# Patient Record
Sex: Male | Born: 1960 | Race: Black or African American | Hispanic: No | Smoking: Former smoker
Health system: Southern US, Community
[De-identification: ages and names within clinical notes are randomized; demographics above are authoritative.]

## PROBLEM LIST (undated history)

## (undated) DIAGNOSIS — E7800 Pure hypercholesterolemia, unspecified: Secondary | ICD-10-CM

## (undated) DIAGNOSIS — I10 Essential (primary) hypertension: Secondary | ICD-10-CM

---

## 2011-08-22 ENCOUNTER — Emergency Department (HOSPITAL_COMMUNITY)
Admission: EM | Admit: 2011-08-22 | Discharge: 2011-08-22 | Disposition: A | Discharge status: Home / Self Care | Payer: 59 | Attending: Emergency Medicine | Admitting: Emergency Medicine

## 2011-08-22 DIAGNOSIS — R112 Nausea with vomiting, unspecified: Secondary | ICD-10-CM | POA: Insufficient documentation

## 2011-08-22 DIAGNOSIS — R509 Fever, unspecified: Secondary | ICD-10-CM | POA: Insufficient documentation

## 2011-08-22 DIAGNOSIS — K2970 Gastritis, unspecified, without bleeding: Secondary | ICD-10-CM | POA: Insufficient documentation

## 2011-08-22 DIAGNOSIS — R197 Diarrhea, unspecified: Secondary | ICD-10-CM | POA: Insufficient documentation

## 2011-08-22 LAB — COMPREHENSIVE METABOLIC PANEL
AST: 28 U/L (ref 0–37)
Albumin: 4.1 g/dL (ref 3.5–5.2)
Alkaline Phosphatase: 68 U/L (ref 39–117)
BUN: 19 mg/dL (ref 6–23)
Potassium: 3.5 mEq/L (ref 3.5–5.1)
Sodium: 139 mEq/L (ref 135–145)
Total Protein: 8 g/dL (ref 6.0–8.3)

## 2011-08-22 LAB — DIFFERENTIAL
Basophils Absolute: 0 10*3/uL (ref 0.0–0.1)
Basophils Relative: 0 % (ref 0–1)
Eosinophils Absolute: 0 10*3/uL (ref 0.0–0.7)
Eosinophils Relative: 0 % (ref 0–5)
Monocytes Absolute: 1 10*3/uL (ref 0.1–1.0)

## 2011-08-22 LAB — CBC
MCHC: 34.9 g/dL (ref 30.0–36.0)
Platelets: 232 10*3/uL (ref 150–400)
RDW: 13.2 % (ref 11.5–15.5)

## 2011-08-22 LAB — LIPASE, BLOOD: Lipase: 29 U/L (ref 11–59)

## 2012-07-29 ENCOUNTER — Ambulatory Visit (INDEPENDENT_AMBULATORY_CARE_PROVIDER_SITE_OTHER): Payer: 59 | Admitting: Internal Medicine

## 2012-07-29 VITALS — BP 138/98 | HR 94 | Temp 98.3°F | Resp 18 | Ht 67.0 in | Wt 200.2 lb

## 2012-07-29 DIAGNOSIS — M25549 Pain in joints of unspecified hand: Secondary | ICD-10-CM

## 2012-07-29 DIAGNOSIS — L600 Ingrowing nail: Secondary | ICD-10-CM

## 2012-07-29 DIAGNOSIS — I10 Essential (primary) hypertension: Secondary | ICD-10-CM

## 2012-07-29 NOTE — Progress Notes (Signed)
  Subjective:    Patient ID: Jesse Horton, male    DOB: 21-May-1961, 51 y.o.   MRN: [id number]  HPI Toe pain 3 days  Steel toe shoe at work -hurts NKI   Review of Systems HTN stable on meds hctz    Objective:   Physical Exam Blood pressure mildly elevated Left great toe with lateral nail margin redness tenderness with granulomatous changes  Procedure=wedge excision  PAC Dunn     Assessment & Plan:  Problem #1 ingrown nail with infection  Wound care/protect til well

## 2012-07-29 NOTE — Progress Notes (Signed)
   Patient ID: Jesse Horton MRN: [id number], DOB: 02-13-1961, 51 y.o. Date of Encounter: 07/29/2012, 2:42 PM   PROCEDURE NOTE: Verbal consent obtained. Sterile technique employed. Numbing: Anesthesia obtained with 2% plain lidocaine for digital block 4 cc. Betadine prep per usual protocol.  Left medial nail lifted without difficulty and removed in total. Proximal aspect of nail bed explored revealing no nail remnants. Ingrown tissue debrided and irrigated. Xeroform dressing applied. Wound care instructions including precautions covered with patient. Handout given.   Signed,  Eula Listen, PA-C 07/29/2012 2:42 PM

## 2013-01-27 ENCOUNTER — Ambulatory Visit (INDEPENDENT_AMBULATORY_CARE_PROVIDER_SITE_OTHER): Payer: 59 | Admitting: Emergency Medicine

## 2013-01-27 VITALS — BP 161/107 | HR 127 | Temp 98.3°F | Resp 17 | Ht 67.5 in | Wt 203.0 lb

## 2013-01-27 DIAGNOSIS — J111 Influenza due to unidentified influenza virus with other respiratory manifestations: Secondary | ICD-10-CM

## 2013-01-27 MED ORDER — HYDROCOD POLST-CHLORPHEN POLST 10-8 MG/5ML PO LQCR: 5.0000 mL | Freq: Two times a day (BID) | ORAL | Status: DC | PRN

## 2013-01-27 MED ORDER — OSELTAMIVIR PHOSPHATE 75 MG PO CAPS: 75.0000 mg | ORAL_CAPSULE | Freq: Two times a day (BID) | ORAL | Status: DC

## 2013-01-27 NOTE — Progress Notes (Signed)
[facility] [address], [place] [address] [phone]  Date:  01/27/2013   Name:  Jesse Horton   DOB:  [birth year]   MRN:  [id number]  PCP:  No primary provider on file.    Chief Complaint: Headache, Sore Throat and Cough   History of Present Illness:  Jesse Horton is a 52 y.o. very pleasant male patient who presents with the following:  Ill for three days with malaise, myalgias, fatigue, cough that is only occasionally productive, nasal congestion and mucoid drainage.  No shortness of breath or wheezing.  No rash.  No ill contacts.  No improvement with OTC medications.  Had a flu shot.  Patient Active Problem List  Diagnosis  . HTN (hypertension)    History reviewed. No pertinent past medical history.  History reviewed. No pertinent past surgical history.  History  Substance Use Topics  . Smoking status: Former Smoker    Quit date: 12/29/1998  . Smokeless tobacco: Not on file  . Alcohol Use: No    History reviewed. No pertinent family history.  No Known Allergies  Medication list has been reviewed and updated.  Current Outpatient Prescriptions on File Prior to Visit  Medication Sig Dispense Refill  . omeprazole (PRILOSEC) 20 MG capsule Take 20 mg by mouth daily.       No current facility-administered medications on file prior to visit.    Review of Systems:  As per HPI, otherwise negative.    Physical Examination: Filed Vitals:   01/27/13 1524  BP: 161/107  Pulse: 127  Temp: 98.3 F (36.8 C)  Resp: 17   Filed Vitals:   01/27/13 1524  Height: 5' 7.5" (1.715 m)  Weight: 203 lb (92.08 kg)   Body mass index is 31.31 kg/(m^2). Ideal Body Weight: Weight in (lb) to have BMI = 25: 161.7  GEN: WDWN, NAD, Non-toxic, A & O x 3 HEENT: Atraumatic, Normocephalic. Neck supple. No masses, No LAD. Ears and Nose: No external deformity. CV: RRR, No M/G/R. No JVD. No thrill. No extra heart sounds. PULM: CTA B, no wheezes,  crackles, rhonchi. No retractions. No resp. distress. No accessory muscle use. ABD: S, NT, ND, +BS. No rebound. No HSM. EXTR: No c/c/e NEURO Normal gait.  PSYCH: Normally interactive. Conversant. Not depressed or anxious appearing.  Calm demeanor.    Assessment and Plan: Influenza tamiflu tussionex   Carmelina Dane, MD

## 2013-01-27 NOTE — Patient Instructions (Signed)

## 2013-01-28 ENCOUNTER — Telehealth: Payer: Self-pay

## 2013-01-28 NOTE — Progress Notes (Signed)
Reviewed and agree.

## 2013-01-28 NOTE — Telephone Encounter (Signed)
Patient requesting an out of work note for tomorrow. Please call when ready at [phone]

## 2013-01-29 ENCOUNTER — Encounter: Payer: Self-pay | Admitting: Radiology

## 2013-01-29 NOTE — Telephone Encounter (Signed)
Spoke with pt and advised him that he can get a work note but he can not return back to work until he is fever free for 24 hours, he did understand, and will pick up letter.

## 2013-01-29 NOTE — Telephone Encounter (Signed)
Left message for pt to call back. He was dx with influenza on Friday, and wants work note til Sunday. Spoke with elizabeth, and confirmed with her to make note for him,stating needs to be without fever for 24 hours.

## 2013-04-21 ENCOUNTER — Ambulatory Visit (INDEPENDENT_AMBULATORY_CARE_PROVIDER_SITE_OTHER): Payer: 59 | Admitting: Internal Medicine

## 2013-04-21 VITALS — BP 142/98 | HR 86 | Temp 98.5°F | Resp 16 | Ht 67.0 in | Wt 204.0 lb

## 2013-04-21 DIAGNOSIS — N529 Male erectile dysfunction, unspecified: Secondary | ICD-10-CM

## 2013-04-21 DIAGNOSIS — K140 Glossitis: Secondary | ICD-10-CM

## 2013-04-21 MED ORDER — SILDENAFIL CITRATE 50 MG PO TABS: 50.0000 mg | ORAL_TABLET | Freq: Every day | ORAL | Status: DC | PRN

## 2013-04-21 NOTE — Progress Notes (Signed)
  Subjective:    Patient ID: Jesse Horton, male    DOB: March 21, 1961, 52 y.o.   MRN: [id number]  HPI C/o sore tongue, no injury and ed and wants viagra. Not sick, no other issues.   Review of Systems neg    Objective:   Physical Exam  Constitutional: He is oriented to person, place, and time. He appears well-developed and well-nourished.  HENT:  Right Ear: External ear normal.  Left Ear: External ear normal.  Nose: Nose normal.  Mouth/Throat: Oropharynx is clear and moist.  Eyes: EOM are normal. No scleral icterus.  Neck: Normal range of motion. Neck supple. No tracheal deviation present. No thyromegaly present.  Cardiovascular: Normal rate.   Pulmonary/Chest: Effort normal.  Lymphadenopathy:    He has cervical adenopathy.  Neurological: He is alert and oriented to person, place, and time. He exhibits normal muscle tone. Coordination normal.  Skin: No rash noted.  Psychiatric: He has a normal mood and affect.          Assessment & Plan:  Glossitis ED

## 2013-04-21 NOTE — Patient Instructions (Addendum)
Erectile Dysfunction  Erectile dysfunction (ED) is the inability to get a good enough erection to have sexual intercourse. ED may involve:  · Inability to get an erection.  · Lack of enough hardness to allow penetration.  · Loss of the erection before sex is finished.  · Premature ejaculation.  · Any combination of these problems if they occur more than 25% of the time.  CAUSES  · Certain drugs, such as:  · Pain relievers.  · Antihistamines.  · Antidepressants.  · Blood pressure medicines.  · Water pills.  · Ulcer medicines.  · Muscle relaxants.  · Illegal drugs.  · Excessive drinking.  · Psychological causes, such as:  · Anxiety.  · Depression.  · Sadness.  · Exhaustion.  · Performance fear.  · Stress.  · Physical causes, such as:  · Artery problems. This may include diabetes, smoking, liver disease, or atherosclerosis.  · High blood pressure.  · Hormonal problems, such as low testosterone.  · Obesity.  · Nerve problems. This may include back or pelvic injuries, diabetes, multiple sclerosis, Parkinson's disease, or some surgeries.  SYMPTOMS  · Inability to get an erection.  · Lack of enough hardness to allow penetration.  · Loss of the erection before sex is finished.  · Premature ejaculation.  · Normal erections at some times, but with frequent unsatisfactory episodes.  · Orgasms that are not satisfactory in sensation or frequency.  · Low sexual satisfaction in either partner because of erection problems.  · A curved penis occurring with erection. The curve may cause pain or may be too curved to allow for intercourse.  · Never having nighttime erections.  DIAGNOSIS  Your caregiver can often diagnose this condition by:  · Performing a physical exam to find other diseases or specific problems with the penis.  · Asking you detailed questions about the problem.  · Performing blood tests to check for diabetes or to measure hormone levels.  · Performing urine tests to find other underlying health  conditions.  · Performing an ultrasound to check for scarring.  · Performing a test to check blood flow to the penis.  · Doing a sleep study at home to measure nighttime erections.  TREATMENT   · You may be prescribed medicines by mouth.  · You may be given medicine injections into the penis.  · You may be prescribed a vacuum pump with a ring.  · Penile implant surgery may be performed. You may receive:  · An inflatable implant.  · A semi-rigid implant.  · Blood vessel surgery may be performed.  HOME CARE INSTRUCTIONS  · Take all medicine as directed by your caregiver. Do not take any other medicines without talking to your caregiver first.  · Follow your caregiver's directions for specific treatments as prescribed.  · Follow up with your caregiver as directed.  Document Released: 11/27/2000 Document Revised: 02/22/2012 Document Reviewed: 03/22/2011  ExitCare® Patient Information ©2013 [employer].

## 2014-04-17 ENCOUNTER — Encounter: Payer: Self-pay | Admitting: Physician Assistant

## 2014-04-17 ENCOUNTER — Ambulatory Visit (INDEPENDENT_AMBULATORY_CARE_PROVIDER_SITE_OTHER): Payer: 59 | Admitting: Physician Assistant

## 2014-04-17 VITALS — BP 142/102 | HR 100 | Temp 97.9°F | Resp 16 | Ht 66.0 in | Wt 194.6 lb

## 2014-04-17 DIAGNOSIS — IMO0001 Reserved for inherently not codable concepts without codable children: Secondary | ICD-10-CM

## 2014-04-17 DIAGNOSIS — R030 Elevated blood-pressure reading, without diagnosis of hypertension: Secondary | ICD-10-CM

## 2014-04-17 DIAGNOSIS — M7521 Bicipital tendinitis, right shoulder: Secondary | ICD-10-CM

## 2014-04-17 DIAGNOSIS — G4700 Insomnia, unspecified: Secondary | ICD-10-CM

## 2014-04-17 DIAGNOSIS — M7520 Bicipital tendinitis, unspecified shoulder: Secondary | ICD-10-CM

## 2014-04-17 DIAGNOSIS — J309 Allergic rhinitis, unspecified: Secondary | ICD-10-CM

## 2014-04-17 MED ORDER — MELOXICAM 15 MG PO TABS: 15.0000 mg | ORAL_TABLET | Freq: Every day | ORAL | Status: DC

## 2014-04-17 MED ORDER — FEXOFENADINE HCL 180 MG PO TABS: 180.0000 mg | ORAL_TABLET | Freq: Every day | ORAL | Status: DC

## 2014-04-17 MED ORDER — HYDROXYZINE HCL 50 MG PO TABS: 50.0000 mg | ORAL_TABLET | Freq: Every evening | ORAL | Status: DC | PRN

## 2014-04-17 NOTE — Patient Instructions (Signed)
(  1) Insomnia - read over the information that I gave you.  The most important thing we can do to get you back on a normal sleep schedule is to perform sleep hygiene.   Sleep hygiene measures: -  Go to bed at the same time every night -  Wake up at the same time every day -  About 20-30 minutes before bed, turn off the TV, put away your cellphone, and dim the lights -  Do not watch TV while you are falling asleep -  Make sure your room is cool and dark -  If you are having trouble falling asleep, get out of bed and read quietly for a few minutes until you are tired, then go back to bed -  Don't do other things in the bedroom/bed like working, playing games, etc  Try the hydroxyzine (Atarax) 20-30 minutes before bed.  This medicine will make you sleepy.  Try 50mg  (1 tab) at first, but you can increase to 100mg  (2 tab) if needed.  I want you to use the medication only for short term though.  The goal is to get you back on a normal sleep schedule that doesn't require medication  (2)  Blood Pressure - stop taking the Allegra-D.  I have sent plain allegra to your pharmacy.  Check your blood pressure 2 times per week for the next month and write them down.  Call or come back in in 1 month so we can review.  If you consistently see numbers >140/>90 then we may need to start you on a medication.  I am hopeful that stopping allegra d will improve your blood pressure  (3)  Biceps Tendinitis - take the meloxicam (Mobic) once daily with food for the next 1-2 weeks.  Do not take any additional ibuprofen or Aleve with this medicine.  Ice the area and frequently and rest when possible.  If you are not improving, please let me know

## 2014-04-17 NOTE — Progress Notes (Signed)
Subjective:    Patient ID: Jesse Horton, male    DOB: 01/14/1961, 53 y.o.   MRN: [id number]  HPI   Jesse Horton is a very pleasant 53 yr old male here with several concerns.     (1)  Reports he has had difficulty sleeping for the last 2 wks.  He is waking frequently, can't get back to sleep.  No prior insomnia.  He typically goes to bed between 9:30-10pm.  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  He has no trouble falling asleep, but last night awoke at midnight and could not go back to sleep.  He has tried Nyquil without success.  He does report increased stress - extra pressure at work.  It has been a long time since he has seen his family as [place] in [place], and this adds stress as well.  He typically works 3-4 days per week.  He does watch TV as he falls asleep.  He drinks coffee in the mornings but does not consume excessive caffeine.  He is experiencing some HA that he attributes to lack of sleep  (2)  Concerned for elevated BP.  142/102 at triage today.  No prior HTN.  Asymptomatic.  He specifically denies CP, SOB, visual change.  He is a slight HA that he attributes to lack of sleep.  He does take Allegra D daily  (3)  Additionally he has concern for RIGHT arm pain.  Pain at distal bicep.  Pain with activity.  He is right handed.  Pain has been present x 1 month.  He has occ taken ibuprofen for this.  He has no weakness or numbness.  He does do repetitive activity at work.   Review of Systems  Constitutional: Negative for fever and chills.  HENT: Negative.   Respiratory: Negative for cough, shortness of breath and wheezing.   Cardiovascular: Negative for chest pain, palpitations and leg swelling.  Gastrointestinal: Negative.   Musculoskeletal: Positive for arthralgias (right arm).  Skin: Negative.   Neurological: Negative for weakness and numbness.  Psychiatric/Behavioral: Positive for sleep disturbance.       Objective:   Physical Exam  Vitals reviewed. Constitutional:  He is oriented to person, place, and time. He appears well-developed and well-nourished. No distress.  HENT:  Head: Normocephalic and atraumatic.  Eyes: Conjunctivae are normal. No scleral icterus.  Cardiovascular: Normal rate, regular rhythm and normal heart sounds.   Pulmonary/Chest: Effort normal and breath sounds normal. He has no wheezes. He has no rales.  Musculoskeletal:       Right elbow: He exhibits normal range of motion, no swelling and no deformity. Tenderness (distal biceps) found.       Arms: Neurological: He is alert and oriented to person, place, and time.  Skin: Skin is warm and dry.  Psychiatric: He has a normal mood and affect. His behavior is normal.       Assessment & Plan:  Jesse Horton is a very pleasant 53 yr old male here for  1. Insomnia New within the last 2 wks, no prior sleep disturbance.  We discussed sleep hygiene at length.  Discussed with pt that this is the most important thing we can do to improve his quality of sleep.  Will also try Atarax qhs.  Start with 50mg , but may increase to 100mg .  If no improvement within 2-3 days, pt to call and we will discuss other medication options.    - hydrOXYzine (ATARAX/VISTARIL) 50 MG tablet; Take 1 tablet (  50 mg total) by mouth at bedtime as needed (Try 1 tab x 1 wk, then may increase to 2 tab).  Dispense: 60 tablet; Refill: 0  2. Allergic rhinitis Currently taking Allegra-D daily.  Suspect this may be contributing to his elevated BP.  Change to plain allegra.  - fexofenadine (ALLEGRA) 180 MG tablet; Take 1 tablet (180 mg total) by mouth daily.  Dispense: 90 tablet; Refill: 3  3. Biceps tendinitis on right Pt with 1 month of tenderness at the RIGHT distal bicep.  He is right hand dominant.  Suspect tendinitis d/t overuse.  He has full ROM and strength.  No deformity.  Will treat conservatively with NSAIDS, ice, relative rest.  If no improvement in 2 wks would consider referral for PT, possibly ortho  - meloxicam  (MOBIC) 15 MG tablet; Take 1 tablet (15 mg total) by mouth daily.  Dispense: 30 tablet; Refill: 0  4. Elevated blood pressure Suspect elevated BP due to decongestant overuse.  Stop decongestant.  Check BPs twice weekly x 1 month.  Pt to call or come in in 1 month to review BPs.  Discussed possibility of starting medication if consistently >140/>90.  Discussed risks associated with uncontrolled HTN.  Pt to call or RTC if worsening or not improving  E. Frances FurbishElizabeth Symir Mah MHS, PA-C [facility] 5/5/20156:33 PM

## 2014-05-14 ENCOUNTER — Other Ambulatory Visit: Payer: Self-pay | Admitting: Physician Assistant

## 2014-05-14 NOTE — Telephone Encounter (Signed)
Called pt to check status of arm pain since Elizabeth's OV note specified she would consider PT or ortho if meloxicam did not help. Pt states that the meloxicam is helping a lot and he would like to continue it. He doesn't feel that PT/ortho is needed. Lanora Manis, do you want to RF?

## 2014-07-04 ENCOUNTER — Other Ambulatory Visit: Payer: Self-pay | Admitting: Internal Medicine

## 2014-07-05 NOTE — Telephone Encounter (Signed)
Jesse Horton saw pt in May for other issues but didn't discuss this med. Can we RF or pt RTC?

## 2015-01-09 ENCOUNTER — Ambulatory Visit (INDEPENDENT_AMBULATORY_CARE_PROVIDER_SITE_OTHER): Payer: 59 | Admitting: Sports Medicine

## 2015-01-09 VITALS — BP 158/92 | HR 89 | Temp 97.9°F | Resp 16 | Ht 67.0 in | Wt 204.0 lb

## 2015-01-09 DIAGNOSIS — N5201 Erectile dysfunction due to arterial insufficiency: Secondary | ICD-10-CM

## 2015-01-09 DIAGNOSIS — IMO0001 Reserved for inherently not codable concepts without codable children: Secondary | ICD-10-CM | POA: Insufficient documentation

## 2015-01-09 DIAGNOSIS — I10 Essential (primary) hypertension: Secondary | ICD-10-CM

## 2015-01-09 DIAGNOSIS — R030 Elevated blood-pressure reading, without diagnosis of hypertension: Secondary | ICD-10-CM | POA: Insufficient documentation

## 2015-01-09 MED ORDER — SILDENAFIL CITRATE 50 MG PO TABS: 50.0000 mg | ORAL_TABLET | ORAL | Status: AC | PRN

## 2015-01-09 NOTE — Progress Notes (Signed)
   Subjective:    Patient ID: Lovenia ShuckHamidou Runner, male    DOB: 02/21/1961, 54 y.o.   MRN: [id number]  HPI Mr. Windy CarinaMounkaila is a 54 year-old male who presents for a medication refill of his Viagra. He has a hx of ED, which is relieved with Viagra for several years.  No Urinary Urgency No Nocturia No Hematuria No Stress/Anxiety No Headache Sexually active with one partner.  Also, he has a hx of uncontrolled HTN. Interested in starting treatment.  He admits to eating high salt diet and drinking energy drinks. He does not get routine aerobic exercise, but rather does pushups in his home. He is currently asymptomatic today. No CP No Palpitations No Blurry vision Mild Caffeine use No Stimulants Decongestants- just nasocort.  No Family Hx of MI or Stroke.  Review of Systems 7 point review of systems was performed and was otherwise negative unless noted in the history of present illness.     Objective:   Physical Exam BP 158/92 mmHg  Pulse 89  Temp(Src) 97.9 F (36.6 C)  Resp 16  Ht 5\' 7"  (1.702 m)  Wt 204 lb (92.534 kg)  BMI 31.94 kg/m2  SpO2 97% General appearance: alert, cooperative and appears stated age Eyes: conjunctivae/corneas clear. PERRL, EOM's intact. Fundi benign. Lungs: clear to auscultation bilaterally Heart: regular rate and rhythm, S1, S2 normal, no murmur, click, rub or gallop Extremities: extremities normal, atraumatic, no cyanosis or edema Pulses: 2+ and symmetric     Assessment & Plan:  1.  Erectile dysfunction - discussed modifiable risk factors -  Prescription provided  2.   Uncontrolled Hypertension, currently asymptomatic.  Has been elevated in the past. - we discussed modifying his diet to include lower sodium and cut out energy drinks. I gave him information about the DASH diet. - We discussed the importance of 30 minutes of aerobic exercise most days of the week. - I discussed how this problem related to his erectile dysfunction. - we briefly  discussed starting treatment for his hypertension with medication, however we then identified these modifiable risk factors. He would like to work on his diet and exercise regimen over the next month. - [facility]. We will have him arrange for an appointment in 1 month. - He can follow-up with us sooner if needed.  Dr. Joellyn HaffPick-Jacobs, DO Sports Medicine Fellow

## 2015-01-09 NOTE — Patient Instructions (Signed)
DASH Eating Plan DASH stands for "Dietary Approaches to Stop Hypertension." The DASH eating plan is a healthy eating plan that has been shown to reduce high blood pressure (hypertension). Additional health benefits may include reducing the risk of type 2 diabetes mellitus, heart disease, and stroke. The DASH eating plan may also help with weight loss. WHAT DO I NEED TO KNOW ABOUT THE DASH EATING PLAN? For the DASH eating plan, you will follow these general guidelines:  Choose foods with a percent daily value for sodium of less than 5% (as listed on the food label).  Use salt-free seasonings or herbs instead of table salt or sea salt.  Check with your health care provider or pharmacist before using salt substitutes.  Eat lower-sodium products, often labeled as "lower sodium" or "no salt added."  Eat fresh foods.  Eat more vegetables, fruits, and low-fat dairy products.  Choose whole grains. Look for the word "whole" as the first word in the ingredient list.  Choose fish and skinless chicken or Malaw[place]key more often than red meat. Limit fish, poultry, and meat to 6 oz (170 g) each day.  Limit sweets, desserts, sugars, and sugary drinks.  Choose heart-healthy fats.  Limit cheese to 1 oz (28 g) per day.  Eat more home-cooked food and less restaurant, buffet, and fast food.  Limit fried foods.  Cut out energy drinks  Cook foods using methods other than frying.  Limit canned vegetables. If you do use them, rinse them well to decrease the sodium.  When eating at a restaurant, ask that your food be prepared with less salt, or no salt if possible. WHAT FOODS CAN I EAT? Seek help from a dietitian for individual calorie needs. Grains Whole grain or whole wheat bread. Brown rice. Whole grain or whole wheat pasta. Quinoa, bulgur, and whole grain cereals. Low-sodium cereals. Corn or whole wheat flour tortillas. Whole grain cornbread. Whole grain crackers. Low-sodium  crackers. Vegetables Fresh or frozen vegetables (raw, steamed, roasted, or grilled). Low-sodium or reduced-sodium tomato and vegetable juices. Low-sodium or reduced-sodium tomato sauce and paste. Low-sodium or reduced-sodium canned vegetables.  Fruits All fresh, canned (in natural juice), or frozen fruits. Meat and Other Protein Products Ground beef (85% or leaner), grass-fed beef, or beef trimmed of fat. Skinless chicken or [place]key. Ground chicken or [place]key. Pork trimmed of fat. All fish and seafood. Eggs. Dried beans, peas, or lentils. Unsalted nuts and seeds. Unsalted canned beans. Dairy Low-fat dairy products, such as skim or 1% milk, 2% or reduced-fat cheeses, low-fat ricotta or cottage cheese, or plain low-fat yogurt. Low-sodium or reduced-sodium cheeses. Fats and Oils Tub margarines without trans fats. Light or reduced-fat mayonnaise and salad dressings (reduced sodium). Avocado. Safflower, olive, or canola oils. Natural peanut or almond butter. Other Unsalted popcorn and pretzels. The items listed above may not be a complete list of recommended foods or beverages. Contact your dietitian for more options. WHAT FOODS ARE NOT RECOMMENDED? Grains White bread. White pasta. White rice. Refined cornbread. Bagels and croissants. Crackers that contain trans fat. Vegetables Creamed or fried vegetables. Vegetables in a cheese sauce. Regular canned vegetables. Regular canned tomato sauce and paste. Regular tomato and vegetable juices. Fruits Dried fruits. Canned fruit in light or heavy syrup. Fruit juice. Meat and Other Protein Products Fatty cuts of meat. Ribs, chicken wings, bacon, sausage, bologna, salami, chitterlings, fatback, hot dogs, bratwurst, and packaged luncheon meats. Salted nuts and seeds. Canned beans with salt. Dairy Whole or 2% milk, cream, half-and-half, and cream cheese.  Whole-fat or sweetened yogurt. Full-fat cheeses or blue cheese. Nondairy creamers and whipped toppings.  Processed cheese, cheese spreads, or cheese curds. Condiments Onion and garlic salt, seasoned salt, table salt, and sea salt. Canned and packaged gravies. Worcestershire sauce. Tartar sauce. Barbecue sauce. Teriyaki sauce. Soy sauce, including reduced sodium. Steak sauce. Fish sauce. Oyster sauce. Cocktail sauce. Horseradish. Ketchup and mustard. Meat flavorings and tenderizers. Bouillon cubes. Hot sauce. Tabasco sauce. Marinades. Taco seasonings. Relishes. Fats and Oils Butter, stick margarine, lard, shortening, ghee, and bacon fat. Coconut, palm kernel, or palm oils. Regular salad dressings. Other Pickles and olives. Salted popcorn and pretzels. The items listed above may not be a complete list of foods and beverages to avoid. Contact your dietitian for more information. WHERE CAN I FIND MORE INFORMATION? National Heart, Lung, and Blood Institute: travelstabloid.com Document Released: 11/19/2011 Document Revised: 04/16/2014 Document Reviewed: 10/04/2013 [facility] Patient Information 2015 [place], [place]. This information is not intended to replace advice given to you by your health care provider. Make sure you discuss any questions you have with your health care provider.

## 2015-02-11 ENCOUNTER — Ambulatory Visit: Payer: Self-pay | Admitting: Family Medicine

## 2016-12-16 DIAGNOSIS — J301 Allergic rhinitis due to pollen: Secondary | ICD-10-CM | POA: Diagnosis not present

## 2016-12-16 DIAGNOSIS — J3089 Other allergic rhinitis: Secondary | ICD-10-CM | POA: Diagnosis not present

## 2016-12-16 DIAGNOSIS — J3081 Allergic rhinitis due to animal (cat) (dog) hair and dander: Secondary | ICD-10-CM | POA: Diagnosis not present

## 2016-12-24 DIAGNOSIS — J301 Allergic rhinitis due to pollen: Secondary | ICD-10-CM | POA: Diagnosis not present

## 2016-12-24 DIAGNOSIS — J3081 Allergic rhinitis due to animal (cat) (dog) hair and dander: Secondary | ICD-10-CM | POA: Diagnosis not present

## 2016-12-24 DIAGNOSIS — J3089 Other allergic rhinitis: Secondary | ICD-10-CM | POA: Diagnosis not present

## 2017-01-08 DIAGNOSIS — J3081 Allergic rhinitis due to animal (cat) (dog) hair and dander: Secondary | ICD-10-CM | POA: Diagnosis not present

## 2017-01-08 DIAGNOSIS — J301 Allergic rhinitis due to pollen: Secondary | ICD-10-CM | POA: Diagnosis not present

## 2017-01-08 DIAGNOSIS — J3089 Other allergic rhinitis: Secondary | ICD-10-CM | POA: Diagnosis not present

## 2017-01-14 DIAGNOSIS — J301 Allergic rhinitis due to pollen: Secondary | ICD-10-CM | POA: Diagnosis not present

## 2017-01-14 DIAGNOSIS — J3081 Allergic rhinitis due to animal (cat) (dog) hair and dander: Secondary | ICD-10-CM | POA: Diagnosis not present

## 2017-01-14 DIAGNOSIS — J3089 Other allergic rhinitis: Secondary | ICD-10-CM | POA: Diagnosis not present

## 2017-01-22 DIAGNOSIS — J301 Allergic rhinitis due to pollen: Secondary | ICD-10-CM | POA: Diagnosis not present

## 2017-01-22 DIAGNOSIS — J3081 Allergic rhinitis due to animal (cat) (dog) hair and dander: Secondary | ICD-10-CM | POA: Diagnosis not present

## 2017-01-22 DIAGNOSIS — J3089 Other allergic rhinitis: Secondary | ICD-10-CM | POA: Diagnosis not present

## 2017-01-28 DIAGNOSIS — J3081 Allergic rhinitis due to animal (cat) (dog) hair and dander: Secondary | ICD-10-CM | POA: Diagnosis not present

## 2017-01-28 DIAGNOSIS — J3089 Other allergic rhinitis: Secondary | ICD-10-CM | POA: Diagnosis not present

## 2017-01-28 DIAGNOSIS — J301 Allergic rhinitis due to pollen: Secondary | ICD-10-CM | POA: Diagnosis not present

## 2017-02-05 DIAGNOSIS — J3081 Allergic rhinitis due to animal (cat) (dog) hair and dander: Secondary | ICD-10-CM | POA: Diagnosis not present

## 2017-02-05 DIAGNOSIS — J301 Allergic rhinitis due to pollen: Secondary | ICD-10-CM | POA: Diagnosis not present

## 2017-02-05 DIAGNOSIS — J3089 Other allergic rhinitis: Secondary | ICD-10-CM | POA: Diagnosis not present

## 2017-02-11 DIAGNOSIS — J3081 Allergic rhinitis due to animal (cat) (dog) hair and dander: Secondary | ICD-10-CM | POA: Diagnosis not present

## 2017-02-11 DIAGNOSIS — J3089 Other allergic rhinitis: Secondary | ICD-10-CM | POA: Diagnosis not present

## 2017-02-11 DIAGNOSIS — J301 Allergic rhinitis due to pollen: Secondary | ICD-10-CM | POA: Diagnosis not present

## 2017-02-19 DIAGNOSIS — J301 Allergic rhinitis due to pollen: Secondary | ICD-10-CM | POA: Diagnosis not present

## 2017-02-19 DIAGNOSIS — J3089 Other allergic rhinitis: Secondary | ICD-10-CM | POA: Diagnosis not present

## 2017-02-19 DIAGNOSIS — J3081 Allergic rhinitis due to animal (cat) (dog) hair and dander: Secondary | ICD-10-CM | POA: Diagnosis not present

## 2017-02-25 DIAGNOSIS — J3089 Other allergic rhinitis: Secondary | ICD-10-CM | POA: Diagnosis not present

## 2017-02-25 DIAGNOSIS — J3081 Allergic rhinitis due to animal (cat) (dog) hair and dander: Secondary | ICD-10-CM | POA: Diagnosis not present

## 2017-02-25 DIAGNOSIS — J301 Allergic rhinitis due to pollen: Secondary | ICD-10-CM | POA: Diagnosis not present

## 2017-03-01 DIAGNOSIS — J3081 Allergic rhinitis due to animal (cat) (dog) hair and dander: Secondary | ICD-10-CM | POA: Diagnosis not present

## 2017-03-01 DIAGNOSIS — H401131 Primary open-angle glaucoma, bilateral, mild stage: Secondary | ICD-10-CM | POA: Diagnosis not present

## 2017-03-01 DIAGNOSIS — H2513 Age-related nuclear cataract, bilateral: Secondary | ICD-10-CM | POA: Diagnosis not present

## 2017-03-01 DIAGNOSIS — J301 Allergic rhinitis due to pollen: Secondary | ICD-10-CM | POA: Diagnosis not present

## 2017-03-01 DIAGNOSIS — J3089 Other allergic rhinitis: Secondary | ICD-10-CM | POA: Diagnosis not present

## 2017-03-10 DIAGNOSIS — J3081 Allergic rhinitis due to animal (cat) (dog) hair and dander: Secondary | ICD-10-CM | POA: Diagnosis not present

## 2017-03-10 DIAGNOSIS — J301 Allergic rhinitis due to pollen: Secondary | ICD-10-CM | POA: Diagnosis not present

## 2017-03-10 DIAGNOSIS — J3089 Other allergic rhinitis: Secondary | ICD-10-CM | POA: Diagnosis not present

## 2017-03-18 DIAGNOSIS — J3081 Allergic rhinitis due to animal (cat) (dog) hair and dander: Secondary | ICD-10-CM | POA: Diagnosis not present

## 2017-03-18 DIAGNOSIS — J301 Allergic rhinitis due to pollen: Secondary | ICD-10-CM | POA: Diagnosis not present

## 2017-03-18 DIAGNOSIS — J3089 Other allergic rhinitis: Secondary | ICD-10-CM | POA: Diagnosis not present

## 2017-03-19 DIAGNOSIS — H401131 Primary open-angle glaucoma, bilateral, mild stage: Secondary | ICD-10-CM | POA: Diagnosis not present

## 2017-03-24 DIAGNOSIS — J3081 Allergic rhinitis due to animal (cat) (dog) hair and dander: Secondary | ICD-10-CM | POA: Diagnosis not present

## 2017-03-24 DIAGNOSIS — J3089 Other allergic rhinitis: Secondary | ICD-10-CM | POA: Diagnosis not present

## 2017-03-24 DIAGNOSIS — J301 Allergic rhinitis due to pollen: Secondary | ICD-10-CM | POA: Diagnosis not present

## 2017-04-01 ENCOUNTER — Ambulatory Visit (INDEPENDENT_AMBULATORY_CARE_PROVIDER_SITE_OTHER): Payer: 59 | Admitting: Physician Assistant

## 2017-04-01 VITALS — BP 150/100 | HR 98 | Temp 98.2°F | Resp 16 | Ht 67.0 in | Wt 204.8 lb

## 2017-04-01 DIAGNOSIS — J9801 Acute bronchospasm: Secondary | ICD-10-CM | POA: Diagnosis not present

## 2017-04-01 DIAGNOSIS — I10 Essential (primary) hypertension: Secondary | ICD-10-CM

## 2017-04-01 DIAGNOSIS — K643 Fourth degree hemorrhoids: Secondary | ICD-10-CM | POA: Diagnosis not present

## 2017-04-01 DIAGNOSIS — Z889 Allergy status to unspecified drugs, medicaments and biological substances status: Secondary | ICD-10-CM | POA: Diagnosis not present

## 2017-04-01 MED ORDER — GUAIFENESIN ER 1200 MG PO TB12: 1.0000 | ORAL_TABLET | Freq: Two times a day (BID) | ORAL | 1 refills | Status: DC | PRN

## 2017-04-01 MED ORDER — PREDNISONE 20 MG PO TABS: ORAL_TABLET | ORAL | 0 refills | Status: DC

## 2017-04-01 MED ORDER — ALBUTEROL SULFATE HFA 108 (90 BASE) MCG/ACT IN AERS: 2.0000 | INHALATION_SPRAY | RESPIRATORY_TRACT | 1 refills | Status: DC | PRN

## 2017-04-01 MED ORDER — FLUTICASONE PROPIONATE 50 MCG/ACT NA SUSP: 2.0000 | Freq: Every day | NASAL | 12 refills | Status: DC

## 2017-04-01 MED ORDER — HYDROCORTISONE ACETATE 25 MG RE SUPP: 25.0000 mg | Freq: Two times a day (BID) | RECTAL | 0 refills | Status: DC

## 2017-04-01 MED ORDER — FEXOFENADINE HCL 180 MG PO TABS: 180.0000 mg | ORAL_TABLET | Freq: Every day | ORAL | 11 refills | Status: DC

## 2017-04-01 MED ORDER — AMLODIPINE BESYLATE 5 MG PO TABS: 5.0000 mg | ORAL_TABLET | Freq: Every day | ORAL | 1 refills | Status: DC

## 2017-04-01 MED ORDER — OLOPATADINE HCL 0.1 % OP SOLN: 1.0000 [drp] | Freq: Two times a day (BID) | OPHTHALMIC | 12 refills | Status: DC

## 2017-04-01 NOTE — Patient Instructions (Addendum)
I am placing a referral for gastroenterology.  This will likely need a procedure called banding because it is so prominent. Do three soaks per day in a bath tub for at least 15 minute.   Dry and place underwear.  Bleeding should diminish within 48 hours.  Allergic Rhinitis Allergic rhinitis is when the mucous membranes in the nose respond to allergens. Allergens are particles in the air that cause your body to have an allergic reaction. This causes you to release allergic antibodies. Through a chain of events, these eventually cause you to release histamine into the blood stream. Although meant to protect the body, it is this release of histamine that causes your discomfort, such as frequent sneezing, congestion, and an itchy, runny nose. What are the causes? Seasonal allergic rhinitis (hay fever) is caused by pollen allergens that may come from grasses, trees, and weeds. Year-round allergic rhinitis (perennial allergic rhinitis) is caused by allergens such as house dust mites, pet dander, and mold spores. What are the signs or symptoms?  Nasal stuffiness (congestion).  Itchy, runny nose with sneezing and tearing of the eyes. How is this diagnosed? Your health care provider can help you determine the allergen or allergens that trigger your symptoms. If you and your health care provider are unable to determine the allergen, skin or blood testing may be used. Your health care provider will diagnose your condition after taking your health history and performing a physical exam. Your health care provider may assess you for other related conditions, such as asthma, pink eye, or an ear infection. How is this treated? Allergic rhinitis does not have a cure, but it can be controlled by:  Medicines that block allergy symptoms. These may include allergy shots, nasal sprays, and oral antihistamines.  Avoiding the allergen. Hay fever may often be treated with antihistamines in pill or nasal spray forms.  Antihistamines block the effects of histamine. There are over-the-counter medicines that may help with nasal congestion and swelling around the eyes. Check with your health care provider before taking or giving this medicine. If avoiding the allergen or the medicine prescribed do not work, there are many new medicines your health care provider can prescribe. Stronger medicine may be used if initial measures are ineffective. Desensitizing injections can be used if medicine and avoidance does not work. Desensitization is when a patient is given ongoing shots until the body becomes less sensitive to the allergen. Make sure you follow up with your health care provider if problems continue. Follow these instructions at home: It is not possible to completely avoid allergens, but you can reduce your symptoms by taking steps to limit your exposure to them. It helps to know exactly what you are allergic to so that you can avoid your specific triggers. Contact a health care provider if:  You have a fever.  You develop a cough that does not stop easily (persistent).  You have shortness of breath.  You start wheezing.  Symptoms interfere with normal daily activities. This information is not intended to replace advice given to you by your health care provider. Make sure you discuss any questions you have with your health care provider. Document Released: 08/25/2001 Document Revised: 07/31/2016 Document Reviewed: 08/07/2013 Elsevier Interactive Patient Education  2017 [employer].    IF you received an x-ray today, you will receive an invoice from [facility]. Please contact [facility] at [phone] with questions or concerns regarding your invoice.   IF you received labwork today, you will receive  an Economist from [employer]. Please contact [facility] at [phone] with questions or concerns regarding your invoice.   Our billing staff will not be able to assist you with questions  regarding bills from these companies.  You will be contacted with the lab results as soon as they are available. The fastest way to get your results is to activate your My Chart account. Instructions are located on the last page of this paperwork. If you have not heard from [place] regarding the results in 2 weeks, please contact this office.

## 2017-04-01 NOTE — Progress Notes (Signed)
PRIMARY CARE AT [facility] [address], [place] [address] [phone]  Date:  04/01/2017   Name:  Jesse Horton   DOB:  26-Sep-1961   MRN:  [id number]  PCP:  No PCP Per Patient    History of Present Illness:  Jesse Horton is a 56 y.o. male patient who presents to PCP with  Chief Complaint  Patient presents with  . Cough    Non productive, Pt believes to be related to allergies  . Hemorrhoids     1 week ago, non-productive cough that is dry.  No nasal congestion.  He is having watery eyes or something in his eyes, when he is outside.  He has no difficulty or sob.   He felt some subjective fever, though never took anything for this.   He takes zyrtec every day.   Painful on the buttock 1 week ago.  He has no constipation.  No blood or black stool.  He can feel something along the buttock.  He is using preparation H.  He uses a suppository at this time, daily.    Patient Active Problem List   Diagnosis Date Noted  . Erectile dysfunction due to arterial insufficiency 01/09/2015  . Elevated blood pressure 01/09/2015  . HTN (hypertension) 07/29/2012    No past medical history on file.  No past surgical history on file.  Social History  Substance Use Topics  . Smoking status: Former Smoker    Quit date: 12/29/1998  . Smokeless tobacco: Never Used  . Alcohol use No    No family history on file.  No Known Allergies  Medication list has been reviewed and updated.  Current Outpatient Prescriptions on File Prior to Visit  Medication Sig Dispense Refill  . Omeprazole-Sodium Bicarbonate (ZEGERID OTC PO) Take by mouth.    . sildenafil (VIAGRA) 50 MG tablet Take 1 tablet (50 mg total) by mouth as needed for erectile dysfunction. Needs office visit 10 tablet 1   No current facility-administered medications on file prior to visit.     ROS ROS otherwise unremarkable unless listed above.  Physical Examination: BP (!) 178/110   Pulse 98   Temp 98.2 F (36.8 C)  (Oral)   Resp 16   Ht  (1.702 m)   Wt 204 lb 12.8 oz (92.9 kg)   SpO2 98%   BMI 32.08 kg/m  Ideal Body Weight: Weight in (lb) to have BMI = 25: 159.3  Physical Exam  Constitutional: He is oriented to person, place, and time. He appears well-developed and well-nourished. No distress.  HENT:  Head: Normocephalic and atraumatic.  Right Ear: Tympanic membrane, external ear and ear canal normal.  Left Ear: Tympanic membrane, external ear and ear canal normal.  Nose: Mucosal edema and rhinorrhea present. Right sinus exhibits no maxillary sinus tenderness and no frontal sinus tenderness. Left sinus exhibits no maxillary sinus tenderness and no frontal sinus tenderness.  Mouth/Throat: No uvula swelling. No oropharyngeal exudate, posterior oropharyngeal edema or posterior oropharyngeal erythema.  Eyes: Conjunctivae, EOM and lids are normal. Pupils are equal, round, and reactive to light. Right eye exhibits normal extraocular motion. Left eye exhibits normal extraocular motion.  Neck: Trachea normal and full passive range of motion without pain. No edema and no erythema present.  Cardiovascular: Normal rate.   Pulmonary/Chest: Effort normal. No respiratory distress. He has no decreased breath sounds. He has no wheezes. He has no rhonchi.  Genitourinary: Rectal exam shows external hemorrhoid (external hemorrhoid, very soft without blood clots  readily detected.  the 12o'clock region with mildly hardened area without erthema or tenderness).  Neurological: He is alert and oriented to person, place, and time.  Skin: Skin is warm and dry. He is not diaphoretic.  Psychiatric: He has a normal mood and affect. His behavior is normal.   Procedure: verbal consent obtained.  Alcohol swabbed. 1% lidocaine placed at the hemorrhoid site.  povidine swabbed.  elliptical incision performed.  2 small blood clots, and dark sanguinous fluid expressed.  Hemorrhoid still enlarged,  Searched without blood clots.   Cleansed with saline.     Assessment and Plan: [name] is a 56 y.o. male who is here today  He will continue allergy shots. Given allegra.  Advised to discontinue the zyrtec at this time.   Inhaler give for what appears to be allergy induced bronchospasms. Hemorrhoid is rather large without many clots obtained.  Advised warm soaks and anusol. Consult with gastroenterology issued.  Likely would need banding with this prominent hemorrhoid.  Follow up in 2 weeks for blood pressure recheck. Essential hypertension - Plan: amLODipine (NORVASC) 5 MG tablet  Multiple allergies - Plan: fexofenadine (ALLEGRA) 180 MG tablet, predniSONE (DELTASONE) 20 MG tablet, olopatadine (PATANOL) 0.1 % ophthalmic solution, albuterol (PROVENTIL HFA;VENTOLIN HFA) 108 (90 Base) MCG/ACT inhaler, Guaifenesin (MUCINEX MAXIMUM STRENGTH) 1200 MG TB12, fluticasone (FLONASE) 50 MCG/ACT nasal spray  Bronchospasm - Plan: albuterol (PROVENTIL HFA;VENTOLIN HFA) 108 (90 Base) MCG/ACT inhaler  Grade IV hemorrhoids - Plan: Ambulatory referral to Gastroenterology  [name], PA-C [facility] 4/19/20183:34 PM

## 2017-04-05 ENCOUNTER — Other Ambulatory Visit: Payer: Self-pay | Admitting: Physician Assistant

## 2017-04-05 ENCOUNTER — Ambulatory Visit (INDEPENDENT_AMBULATORY_CARE_PROVIDER_SITE_OTHER): Payer: 59 | Admitting: Physician Assistant

## 2017-04-05 VITALS — BP 130/90 | HR 118 | Temp 98.9°F | Resp 18 | Ht 67.0 in | Wt 202.4 lb

## 2017-04-05 DIAGNOSIS — K629 Disease of anus and rectum, unspecified: Secondary | ICD-10-CM

## 2017-04-05 DIAGNOSIS — K6289 Other specified diseases of anus and rectum: Secondary | ICD-10-CM

## 2017-04-05 DIAGNOSIS — J3089 Other allergic rhinitis: Secondary | ICD-10-CM | POA: Diagnosis not present

## 2017-04-05 DIAGNOSIS — I10 Essential (primary) hypertension: Secondary | ICD-10-CM

## 2017-04-05 DIAGNOSIS — J3081 Allergic rhinitis due to animal (cat) (dog) hair and dander: Secondary | ICD-10-CM | POA: Diagnosis not present

## 2017-04-05 DIAGNOSIS — J301 Allergic rhinitis due to pollen: Secondary | ICD-10-CM | POA: Diagnosis not present

## 2017-04-05 LAB — POCT CBC
Granulocyte percent: 46.4 %G (ref 37–80)
HEMATOCRIT: 44.4 % (ref 43.5–53.7)
Hemoglobin: 15.2 g/dL (ref 14.1–18.1)
LYMPH, POC: 2.3 (ref 0.6–3.4)
MCH, POC: 29.6 pg (ref 27–31.2)
MCHC: 34.2 g/dL (ref 31.8–35.4)
MCV: 86.4 fL (ref 80–97)
MID (cbc): 0.4 (ref 0–0.9)
MPV: 5.9 fL (ref 0–99.8)
POC GRANULOCYTE: 2.4 (ref 2–6.9)
POC LYMPH %: 45.1 % (ref 10–50)
POC MID %: 8.5 %M (ref 0–12)
Platelet Count, POC: 316 10*3/uL (ref 142–424)
RBC: 5.13 M/uL (ref 4.69–6.13)
RDW, POC: 13.6 %
WBC: 5.2 10*3/uL (ref 4.6–10.2)

## 2017-04-05 MED ORDER — ALCAFTADINE 0.25 % OP SOLN: 1.0000 [drp] | Freq: Every day | OPHTHALMIC | 5 refills | Status: DC

## 2017-04-05 MED ORDER — ALBUTEROL SULFATE HFA 108 (90 BASE) MCG/ACT IN AERS: 2.0000 | INHALATION_SPRAY | Freq: Four times a day (QID) | RESPIRATORY_TRACT | 5 refills | Status: DC | PRN

## 2017-04-05 MED ORDER — TRAMADOL HCL 50 MG PO TABS: 50.0000 mg | ORAL_TABLET | Freq: Three times a day (TID) | ORAL | 0 refills | Status: DC | PRN

## 2017-04-05 NOTE — Patient Instructions (Signed)
     IF you received an x-ray today, you will receive an invoice from [facility]. Please contact [facility] at [phone] with questions or concerns regarding your invoice.   IF you received labwork today, you will receive an invoice from [facility]. Please contact [facility] at [phone] with questions or concerns regarding your invoice.   Our billing staff will not be able to assist you with questions regarding bills from these companies.  You will be contacted with the lab results as soon as they are available. The fastest way to get your results is to activate your My Chart account. Instructions are located on the last page of this paperwork. If you have not heard from us regarding the results in 2 weeks, please contact this office.     

## 2017-04-06 ENCOUNTER — Other Ambulatory Visit: Payer: Self-pay | Admitting: Physician Assistant

## 2017-04-07 NOTE — Progress Notes (Signed)
PRIMARY CARE AT [facility] [address] [place] [address] [phone]  Date:  04/05/2017   Name:  Jesse Horton   DOB:  [birth year]   MRN:  [id number]  PCP:  No PCP Per Patient    History of Present Illness:  Jesse Horton is a 56 y.o. male patient who presents to PCP with  Chief Complaint  Patient presents with  . Follow-up    hemorroids     Patient is here for follow up of hemorrhoids.  He reports that he continues to have pain at his buttock.  He is doing the soaks minimally and not 4 times per day.  He has had subjective fevers at night.  Hurts with sitting.  He was seen 5 days ago for hemorrhoid grade Iv with an attempt of excision.  Very little by way of clots excised.  Given anusol and referral for gastroenterology.  Reports 8/10 for pain.  He has taken nothing for his symptoms.  He has started the amlodopine.  No side effects.   Patient Active Problem List   Diagnosis Date Noted  . Erectile dysfunction due to arterial insufficiency 01/09/2015  . Elevated blood pressure 01/09/2015  . HTN (hypertension) 07/29/2012    No past medical history on file.  No past surgical history on file.  Social History  Substance Use Topics  . Smoking status: Former Smoker    Quit date: 12/29/1998  . Smokeless tobacco: Never Used  . Alcohol use No    No family history on file.  No Known Allergies  Medication list has been reviewed and updated.  Current Outpatient Prescriptions on File Prior to Visit  Medication Sig Dispense Refill  . amLODipine (NORVASC) 5 MG tablet Take 1 tablet (5 mg total) by mouth daily. 30 tablet 1  . fexofenadine (ALLEGRA) 180 MG tablet Take 1 tablet (180 mg total) by mouth daily. 30 tablet 11  . fluticasone (FLONASE) 50 MCG/ACT nasal spray Place 2 sprays into both nostrils daily. 16 g 12  . Guaifenesin (MUCINEX MAXIMUM STRENGTH) 1200 MG TB12 Take 1 tablet (1,200 mg total) by mouth every 12 (twelve) hours as needed. 14 tablet 1  . hydrocortisone  (ANUSOL-HC) 25 MG suppository Place 1 suppository (25 mg total) rectally 2 (two) times daily. 12 suppository 0  . Omeprazole-Sodium Bicarbonate (ZEGERID OTC PO) Take by mouth.    . phenylephrine-shark liver oil-mineral oil-petrolatum (PREPARATION H) 0.25-3-14-71.9 % rectal ointment Place 1 application rectally 2 (two) times daily as needed for hemorrhoids.    . predniSONE (DELTASONE) 20 MG tablet Take 3 PO QAM x2days, 2 PO QAM x2days, 1 PO QAM x3days 18 tablet 0  . shark liver oil-cocoa butter (PREPARATION H) 0.25-3-85.5 % suppository Place 1 suppository rectally as needed for hemorrhoids.    . sildenafil (VIAGRA) 50 MG tablet Take 1 tablet (50 mg total) by mouth as needed for erectile dysfunction. Needs office visit 10 tablet 1   No current facility-administered medications on file prior to visit.     ROS ROS otherwise unremarkable unless listed above.  Physical Examination: BP 130/90 (BP Location: Right Arm, Cuff Size: Large)   Pulse (!) 118   Temp 98.9 F (37.2 C) (Oral)   Resp 18   Ht  (1.702 m)   Wt 202 lb 6.4 oz (91.8 kg)   SpO2 97%   BMI 31.70 kg/m  Ideal Body Weight: Weight in (lb) to have BMI = 25: 159.3  Physical Exam  Constitutional: He is oriented to person, place,  and time. He appears well-developed and well-nourished. No distress.  HENT:  Head: Normocephalic and atraumatic.  Eyes: Conjunctivae and EOM are normal. Pupils are equal, round, and reactive to light.  Cardiovascular: Normal rate.   Pulmonary/Chest: Effort normal. No respiratory distress.  Genitourinary:  Genitourinary Comments: Hemorrhoid prominent with healing wound.  No necrosis or erythema.  Non-tender.   Neurological: He is alert and oriented to person, place, and time.  Skin: Skin is warm and dry. He is not diaphoretic.  Psychiatric: He has a normal mood and affect. His behavior is normal.   Results for orders placed or performed in visit on 04/05/17  POCT CBC  Result Value Ref Range   WBC  5.2 4.6 - 10.2 K/uL   Lymph, poc 2.3 0.6 - 3.4   POC LYMPH PERCENT 45.1 10 - 50 %L   MID (cbc) 0.4 0 - 0.9   POC MID % 8.5 0 - 12 %M   POC Granulocyte 2.4 2 - 6.9   Granulocyte percent 46.4 37 - 80 %G   RBC 5.13 4.69 - 6.13 M/uL   Hemoglobin 15.2 14.1 - 18.1 g/dL   HCT, POC 16.1 09.6 - 53.7 %   MCV 86.4 80 - 97 fL   MCH, POC 29.6 27 - 31.2 pg   MCHC 34.2 31.8 - 35.4 g/dL   RDW, POC 04.5 %   Platelet Count, POC 316 142 - 424 K/uL   MPV 5.9 0 - 99.8 fL     Assessment and Plan: Jesse Horton is a 56 y.o. male who is here today for hemorrhoid Given tramadol, will await referral for gastroenterology. Rectal mass - Plan: POCT CBC, traMADol (ULTRAM) 50 MG tablet  [name], PA-C [facility] 4/25/20188:17 AM

## 2017-04-08 ENCOUNTER — Other Ambulatory Visit: Payer: Self-pay | Admitting: Physician Assistant

## 2017-04-08 MED ORDER — HYDROCORTISONE ACETATE 25 MG RE SUPP: 25.0000 mg | Freq: Two times a day (BID) | RECTAL | 0 refills | Status: DC

## 2017-04-09 ENCOUNTER — Encounter: Payer: Self-pay | Admitting: Physician Assistant

## 2017-04-14 ENCOUNTER — Encounter: Payer: Self-pay | Admitting: Physician Assistant

## 2017-04-15 DIAGNOSIS — J301 Allergic rhinitis due to pollen: Secondary | ICD-10-CM | POA: Diagnosis not present

## 2017-04-15 DIAGNOSIS — J3089 Other allergic rhinitis: Secondary | ICD-10-CM | POA: Diagnosis not present

## 2017-04-15 DIAGNOSIS — J3081 Allergic rhinitis due to animal (cat) (dog) hair and dander: Secondary | ICD-10-CM | POA: Diagnosis not present

## 2017-04-23 DIAGNOSIS — J3089 Other allergic rhinitis: Secondary | ICD-10-CM | POA: Diagnosis not present

## 2017-04-23 DIAGNOSIS — J3081 Allergic rhinitis due to animal (cat) (dog) hair and dander: Secondary | ICD-10-CM | POA: Diagnosis not present

## 2017-04-23 DIAGNOSIS — J301 Allergic rhinitis due to pollen: Secondary | ICD-10-CM | POA: Diagnosis not present

## 2017-04-30 DIAGNOSIS — J3089 Other allergic rhinitis: Secondary | ICD-10-CM | POA: Diagnosis not present

## 2017-04-30 DIAGNOSIS — J301 Allergic rhinitis due to pollen: Secondary | ICD-10-CM | POA: Diagnosis not present

## 2017-04-30 DIAGNOSIS — J3081 Allergic rhinitis due to animal (cat) (dog) hair and dander: Secondary | ICD-10-CM | POA: Diagnosis not present

## 2017-05-04 DIAGNOSIS — J301 Allergic rhinitis due to pollen: Secondary | ICD-10-CM | POA: Diagnosis not present

## 2017-05-05 DIAGNOSIS — J3081 Allergic rhinitis due to animal (cat) (dog) hair and dander: Secondary | ICD-10-CM | POA: Diagnosis not present

## 2017-05-05 DIAGNOSIS — J3089 Other allergic rhinitis: Secondary | ICD-10-CM | POA: Diagnosis not present

## 2017-05-07 DIAGNOSIS — J3081 Allergic rhinitis due to animal (cat) (dog) hair and dander: Secondary | ICD-10-CM | POA: Diagnosis not present

## 2017-05-07 DIAGNOSIS — J301 Allergic rhinitis due to pollen: Secondary | ICD-10-CM | POA: Diagnosis not present

## 2017-05-07 DIAGNOSIS — J3089 Other allergic rhinitis: Secondary | ICD-10-CM | POA: Diagnosis not present

## 2017-05-14 DIAGNOSIS — J301 Allergic rhinitis due to pollen: Secondary | ICD-10-CM | POA: Diagnosis not present

## 2017-05-14 DIAGNOSIS — J3089 Other allergic rhinitis: Secondary | ICD-10-CM | POA: Diagnosis not present

## 2017-05-14 DIAGNOSIS — J3081 Allergic rhinitis due to animal (cat) (dog) hair and dander: Secondary | ICD-10-CM | POA: Diagnosis not present

## 2017-05-20 DIAGNOSIS — J3089 Other allergic rhinitis: Secondary | ICD-10-CM | POA: Diagnosis not present

## 2017-05-20 DIAGNOSIS — J301 Allergic rhinitis due to pollen: Secondary | ICD-10-CM | POA: Diagnosis not present

## 2017-05-27 DIAGNOSIS — J301 Allergic rhinitis due to pollen: Secondary | ICD-10-CM | POA: Diagnosis not present

## 2017-05-27 DIAGNOSIS — J3081 Allergic rhinitis due to animal (cat) (dog) hair and dander: Secondary | ICD-10-CM | POA: Diagnosis not present

## 2017-05-27 DIAGNOSIS — J3089 Other allergic rhinitis: Secondary | ICD-10-CM | POA: Diagnosis not present

## 2017-06-02 DIAGNOSIS — J301 Allergic rhinitis due to pollen: Secondary | ICD-10-CM | POA: Diagnosis not present

## 2017-06-02 DIAGNOSIS — J3089 Other allergic rhinitis: Secondary | ICD-10-CM | POA: Diagnosis not present

## 2017-06-02 DIAGNOSIS — J3081 Allergic rhinitis due to animal (cat) (dog) hair and dander: Secondary | ICD-10-CM | POA: Diagnosis not present

## 2017-06-11 DIAGNOSIS — J3089 Other allergic rhinitis: Secondary | ICD-10-CM | POA: Diagnosis not present

## 2017-06-11 DIAGNOSIS — J3081 Allergic rhinitis due to animal (cat) (dog) hair and dander: Secondary | ICD-10-CM | POA: Diagnosis not present

## 2017-06-11 DIAGNOSIS — J301 Allergic rhinitis due to pollen: Secondary | ICD-10-CM | POA: Diagnosis not present

## 2017-06-16 ENCOUNTER — Encounter: Payer: Self-pay | Admitting: Physician Assistant

## 2017-06-17 ENCOUNTER — Other Ambulatory Visit: Payer: Self-pay | Admitting: Physician Assistant

## 2017-06-17 DIAGNOSIS — I10 Essential (primary) hypertension: Secondary | ICD-10-CM

## 2017-06-17 MED ORDER — AMLODIPINE BESYLATE 5 MG PO TABS: 5.0000 mg | ORAL_TABLET | Freq: Every day | ORAL | 1 refills | Status: DC

## 2017-06-18 DIAGNOSIS — J3081 Allergic rhinitis due to animal (cat) (dog) hair and dander: Secondary | ICD-10-CM | POA: Diagnosis not present

## 2017-06-18 DIAGNOSIS — J301 Allergic rhinitis due to pollen: Secondary | ICD-10-CM | POA: Diagnosis not present

## 2017-06-18 DIAGNOSIS — J3089 Other allergic rhinitis: Secondary | ICD-10-CM | POA: Diagnosis not present

## 2017-06-25 DIAGNOSIS — J3081 Allergic rhinitis due to animal (cat) (dog) hair and dander: Secondary | ICD-10-CM | POA: Diagnosis not present

## 2017-06-25 DIAGNOSIS — J3089 Other allergic rhinitis: Secondary | ICD-10-CM | POA: Diagnosis not present

## 2017-06-25 DIAGNOSIS — J301 Allergic rhinitis due to pollen: Secondary | ICD-10-CM | POA: Diagnosis not present

## 2017-06-30 DIAGNOSIS — J3081 Allergic rhinitis due to animal (cat) (dog) hair and dander: Secondary | ICD-10-CM | POA: Diagnosis not present

## 2017-06-30 DIAGNOSIS — J301 Allergic rhinitis due to pollen: Secondary | ICD-10-CM | POA: Diagnosis not present

## 2017-06-30 DIAGNOSIS — J3089 Other allergic rhinitis: Secondary | ICD-10-CM | POA: Diagnosis not present

## 2017-07-12 DIAGNOSIS — J3081 Allergic rhinitis due to animal (cat) (dog) hair and dander: Secondary | ICD-10-CM | POA: Diagnosis not present

## 2017-07-12 DIAGNOSIS — J301 Allergic rhinitis due to pollen: Secondary | ICD-10-CM | POA: Diagnosis not present

## 2017-07-12 DIAGNOSIS — J3089 Other allergic rhinitis: Secondary | ICD-10-CM | POA: Diagnosis not present

## 2017-07-23 DIAGNOSIS — J3081 Allergic rhinitis due to animal (cat) (dog) hair and dander: Secondary | ICD-10-CM | POA: Diagnosis not present

## 2017-07-23 DIAGNOSIS — J301 Allergic rhinitis due to pollen: Secondary | ICD-10-CM | POA: Diagnosis not present

## 2017-07-23 DIAGNOSIS — J3089 Other allergic rhinitis: Secondary | ICD-10-CM | POA: Diagnosis not present

## 2017-07-28 DIAGNOSIS — J301 Allergic rhinitis due to pollen: Secondary | ICD-10-CM | POA: Diagnosis not present

## 2017-07-28 DIAGNOSIS — J3081 Allergic rhinitis due to animal (cat) (dog) hair and dander: Secondary | ICD-10-CM | POA: Diagnosis not present

## 2017-07-28 DIAGNOSIS — J3089 Other allergic rhinitis: Secondary | ICD-10-CM | POA: Diagnosis not present

## 2017-08-05 DIAGNOSIS — J301 Allergic rhinitis due to pollen: Secondary | ICD-10-CM | POA: Diagnosis not present

## 2017-08-05 DIAGNOSIS — J3081 Allergic rhinitis due to animal (cat) (dog) hair and dander: Secondary | ICD-10-CM | POA: Diagnosis not present

## 2017-08-05 DIAGNOSIS — J3089 Other allergic rhinitis: Secondary | ICD-10-CM | POA: Diagnosis not present

## 2017-08-12 DIAGNOSIS — J301 Allergic rhinitis due to pollen: Secondary | ICD-10-CM | POA: Diagnosis not present

## 2017-08-12 DIAGNOSIS — J3089 Other allergic rhinitis: Secondary | ICD-10-CM | POA: Diagnosis not present

## 2017-08-12 DIAGNOSIS — J3081 Allergic rhinitis due to animal (cat) (dog) hair and dander: Secondary | ICD-10-CM | POA: Diagnosis not present

## 2017-08-19 DIAGNOSIS — J3081 Allergic rhinitis due to animal (cat) (dog) hair and dander: Secondary | ICD-10-CM | POA: Diagnosis not present

## 2017-08-19 DIAGNOSIS — J3089 Other allergic rhinitis: Secondary | ICD-10-CM | POA: Diagnosis not present

## 2017-08-19 DIAGNOSIS — J301 Allergic rhinitis due to pollen: Secondary | ICD-10-CM | POA: Diagnosis not present

## 2017-08-25 DIAGNOSIS — J3081 Allergic rhinitis due to animal (cat) (dog) hair and dander: Secondary | ICD-10-CM | POA: Diagnosis not present

## 2017-08-25 DIAGNOSIS — J3089 Other allergic rhinitis: Secondary | ICD-10-CM | POA: Diagnosis not present

## 2017-08-25 DIAGNOSIS — J301 Allergic rhinitis due to pollen: Secondary | ICD-10-CM | POA: Diagnosis not present

## 2017-09-03 DIAGNOSIS — J3081 Allergic rhinitis due to animal (cat) (dog) hair and dander: Secondary | ICD-10-CM | POA: Diagnosis not present

## 2017-09-03 DIAGNOSIS — J3089 Other allergic rhinitis: Secondary | ICD-10-CM | POA: Diagnosis not present

## 2017-09-03 DIAGNOSIS — J301 Allergic rhinitis due to pollen: Secondary | ICD-10-CM | POA: Diagnosis not present

## 2017-09-08 DIAGNOSIS — J3081 Allergic rhinitis due to animal (cat) (dog) hair and dander: Secondary | ICD-10-CM | POA: Diagnosis not present

## 2017-09-08 DIAGNOSIS — J301 Allergic rhinitis due to pollen: Secondary | ICD-10-CM | POA: Diagnosis not present

## 2017-09-08 DIAGNOSIS — J3089 Other allergic rhinitis: Secondary | ICD-10-CM | POA: Diagnosis not present

## 2017-09-17 DIAGNOSIS — J3089 Other allergic rhinitis: Secondary | ICD-10-CM | POA: Diagnosis not present

## 2017-09-17 DIAGNOSIS — J301 Allergic rhinitis due to pollen: Secondary | ICD-10-CM | POA: Diagnosis not present

## 2017-09-17 DIAGNOSIS — J3081 Allergic rhinitis due to animal (cat) (dog) hair and dander: Secondary | ICD-10-CM | POA: Diagnosis not present

## 2017-09-23 DIAGNOSIS — J3081 Allergic rhinitis due to animal (cat) (dog) hair and dander: Secondary | ICD-10-CM | POA: Diagnosis not present

## 2017-09-23 DIAGNOSIS — J3089 Other allergic rhinitis: Secondary | ICD-10-CM | POA: Diagnosis not present

## 2017-09-23 DIAGNOSIS — J301 Allergic rhinitis due to pollen: Secondary | ICD-10-CM | POA: Diagnosis not present

## 2017-09-24 DIAGNOSIS — J3081 Allergic rhinitis due to animal (cat) (dog) hair and dander: Secondary | ICD-10-CM | POA: Diagnosis not present

## 2017-09-24 DIAGNOSIS — J301 Allergic rhinitis due to pollen: Secondary | ICD-10-CM | POA: Diagnosis not present

## 2017-09-24 DIAGNOSIS — J3089 Other allergic rhinitis: Secondary | ICD-10-CM | POA: Diagnosis not present

## 2017-09-27 DIAGNOSIS — J3081 Allergic rhinitis due to animal (cat) (dog) hair and dander: Secondary | ICD-10-CM | POA: Diagnosis not present

## 2017-09-27 DIAGNOSIS — J3089 Other allergic rhinitis: Secondary | ICD-10-CM | POA: Diagnosis not present

## 2017-09-27 DIAGNOSIS — J301 Allergic rhinitis due to pollen: Secondary | ICD-10-CM | POA: Diagnosis not present

## 2017-10-06 DIAGNOSIS — J3089 Other allergic rhinitis: Secondary | ICD-10-CM | POA: Diagnosis not present

## 2017-10-06 DIAGNOSIS — J3081 Allergic rhinitis due to animal (cat) (dog) hair and dander: Secondary | ICD-10-CM | POA: Diagnosis not present

## 2017-10-06 DIAGNOSIS — J301 Allergic rhinitis due to pollen: Secondary | ICD-10-CM | POA: Diagnosis not present

## 2017-10-06 DIAGNOSIS — Z23 Encounter for immunization: Secondary | ICD-10-CM | POA: Diagnosis not present

## 2017-10-15 DIAGNOSIS — J3081 Allergic rhinitis due to animal (cat) (dog) hair and dander: Secondary | ICD-10-CM | POA: Diagnosis not present

## 2017-10-15 DIAGNOSIS — J301 Allergic rhinitis due to pollen: Secondary | ICD-10-CM | POA: Diagnosis not present

## 2017-10-15 DIAGNOSIS — J3089 Other allergic rhinitis: Secondary | ICD-10-CM | POA: Diagnosis not present

## 2017-10-21 DIAGNOSIS — J3089 Other allergic rhinitis: Secondary | ICD-10-CM | POA: Diagnosis not present

## 2017-10-21 DIAGNOSIS — J3081 Allergic rhinitis due to animal (cat) (dog) hair and dander: Secondary | ICD-10-CM | POA: Diagnosis not present

## 2017-10-21 DIAGNOSIS — J301 Allergic rhinitis due to pollen: Secondary | ICD-10-CM | POA: Diagnosis not present

## 2017-10-28 ENCOUNTER — Ambulatory Visit: Payer: 59 | Admitting: Physician Assistant

## 2017-10-28 ENCOUNTER — Encounter: Payer: Self-pay | Admitting: Physician Assistant

## 2017-10-28 ENCOUNTER — Other Ambulatory Visit: Payer: Self-pay

## 2017-10-28 VITALS — BP 174/88 | HR 96 | Temp 98.3°F | Resp 16 | Ht 67.0 in | Wt 214.0 lb

## 2017-10-28 DIAGNOSIS — Z889 Allergy status to unspecified drugs, medicaments and biological substances status: Secondary | ICD-10-CM | POA: Diagnosis not present

## 2017-10-28 DIAGNOSIS — J3089 Other allergic rhinitis: Secondary | ICD-10-CM | POA: Diagnosis not present

## 2017-10-28 DIAGNOSIS — J3081 Allergic rhinitis due to animal (cat) (dog) hair and dander: Secondary | ICD-10-CM | POA: Diagnosis not present

## 2017-10-28 DIAGNOSIS — K219 Gastro-esophageal reflux disease without esophagitis: Secondary | ICD-10-CM

## 2017-10-28 DIAGNOSIS — J301 Allergic rhinitis due to pollen: Secondary | ICD-10-CM | POA: Diagnosis not present

## 2017-10-28 MED ORDER — RANITIDINE HCL 150 MG PO TABS: 150.0000 mg | ORAL_TABLET | Freq: Two times a day (BID) | ORAL | 0 refills | Status: DC

## 2017-10-28 MED ORDER — TRIAMCINOLONE ACETONIDE 55 MCG/ACT NA AERO: 2.0000 | INHALATION_SPRAY | Freq: Every day | NASAL | 12 refills | Status: DC

## 2017-10-28 NOTE — Progress Notes (Signed)
PRIMARY CARE AT [facility] [address], [place] [address] [phone]  Date:  10/28/2017   Name:  Jesse Horton   DOB:  02-Jul-1961   MRN:  [id number]  PCP:  Patient, No Pcp Per    History of Present Illness:  Jesse Horton is a 56 y.o. adult patient who presents to PCP with  Chief Complaint  Patient presents with  . Sinusitis    x 1 wk/ chest congestion  . Fatigue    in the am     Allergies with headache, and difficulty with breathing.  When he wakes up, he feels tired.  He wakes with food in mouth reflux.  He will take zegrid.  He is not sneezing.  He has watery eyes.  Nasal congestion.  He has coughing at times. He has no productive cough.  His throat feels dry.  He wild rink water for this, which helps. No nausea.  No black or bloody stool.   He will use his inhaler possibly once per week.  He is taking zyrtec which helps.  He is using the Flonase.   He lives by himself.  It is clean.  He does not dusting.  He has a lot of dust at the work place.   No pets.  No rash.   He has not had his blood pressure medication for 1 month.  No chest pains.  No leg swelling.   Currently having allergy shots for the last year.  Patient Active Problem List   Diagnosis Date Noted  . Erectile dysfunction due to arterial insufficiency 01/09/2015  . Elevated blood pressure 01/09/2015  . HTN (hypertension) 07/29/2012    No past medical history on file.  No past surgical history on file.  Social History   Tobacco Use  . Smoking status: Former Smoker    Last attempt to quit: 12/29/1998    Years since quitting: 18.8  . Smokeless tobacco: Never Used  Substance Use Topics  . Alcohol use: No  . Drug use: No    No family history on file.  No Known Allergies  Medication list has been reviewed and updated.  Current Outpatient Medications on File Prior to Visit  Medication Sig Dispense Refill  . albuterol (VENTOLIN HFA) 108 (90 Base) MCG/ACT inhaler Inhale 2 puffs into the  lungs every 6 (six) hours as needed for wheezing or shortness of breath. 6.7 g 5  . Alcaftadine (LASTACAFT) 0.25 % SOLN Apply 1 drop to eye daily. 3 mL 5  . amLODipine (NORVASC) 5 MG tablet Take 1 tablet (5 mg total) by mouth daily. 90 tablet 1  . fexofenadine (ALLEGRA) 180 MG tablet Take 1 tablet (180 mg total) by mouth daily. 30 tablet 11  . fluticasone (FLONASE) 50 MCG/ACT nasal spray Place 2 sprays into both nostrils daily. 16 g 12  . Guaifenesin (MUCINEX MAXIMUM STRENGTH) 1200 MG TB12 Take 1 tablet (1,200 mg total) by mouth every 12 (twelve) hours as needed. 14 tablet 1  . hydrocortisone (ANUSOL-HC) 25 MG suppository Place 1 suppository (25 mg total) rectally 2 (two) times daily. 12 suppository 0  . Omeprazole-Sodium Bicarbonate (ZEGERID OTC PO) Take by mouth.    . phenylephrine-shark liver oil-mineral oil-petrolatum (PREPARATION H) 0.25-3-14-71.9 % rectal ointment Place 1 application rectally 2 (two) times daily as needed for hemorrhoids.    . predniSONE (DELTASONE) 20 MG tablet Take 3 PO QAM x2days, 2 PO QAM x2days, 1 PO QAM x3days 18 tablet 0  . shark liver oil-cocoa butter (PREPARATION  H) 0.25-3-85.5 % suppository Place 1 suppository rectally as needed for hemorrhoids.    . sildenafil (VIAGRA) 50 MG tablet Take 1 tablet (50 mg total) by mouth as needed for erectile dysfunction. Needs office visit 10 tablet 1  . traMADol (ULTRAM) 50 MG tablet Take 1 tablet (50 mg total) by mouth every 8 (eight) hours as needed. 30 tablet 0   No current facility-administered medications on file prior to visit.     ROS ROS otherwise unremarkable unless listed above.  Physical Examination: BP (!) 174/88   Pulse 96   Temp 98.3 F (36.8 C) (Oral)   Resp 16   Ht 5' 7"  (1.702 m)   Wt 214 lb (97.1 kg)   SpO2 97%   BMI 33.52 kg/m  Ideal Body Weight: Weight in (lb) to have BMI = 25: 159.3  Physical Exam  Constitutional: He is oriented to person, place, and time. He appears well-developed and  well-nourished. No distress.  HENT:  Head: Normocephalic and atraumatic.  Right Ear: Tympanic membrane, external ear and ear canal normal.  Left Ear: Tympanic membrane, external ear and ear canal normal.  Nose: Mucosal edema and rhinorrhea present. Right sinus exhibits no maxillary sinus tenderness and no frontal sinus tenderness. Left sinus exhibits no maxillary sinus tenderness and no frontal sinus tenderness.  Mouth/Throat: No uvula swelling. No oropharyngeal exudate, posterior oropharyngeal edema or posterior oropharyngeal erythema.  Eyes: Conjunctivae and EOM are normal. Pupils are equal, round, and reactive to light.  Cardiovascular: Normal rate and regular rhythm. Exam reveals no gallop, no distant heart sounds and no friction rub.  No murmur heard. Pulmonary/Chest: Effort normal. No respiratory distress. He has no decreased breath sounds. He has no wheezes. He has no rhonchi.  Abdominal: Soft. Normal appearance and bowel sounds are normal. There is tenderness in the epigastric area.  Lymphadenopathy:       Head (right side): No submandibular, no tonsillar, no preauricular and no posterior auricular adenopathy present.       Head (left side): No submandibular, no tonsillar, no preauricular and no posterior auricular adenopathy present.  Neurological: He is alert and oriented to person, place, and time.  Skin: He is not diaphoretic.  Psychiatric: He has a normal mood and affect. His behavior is normal.     Assessment and Plan: Jesse Horton is a 56 y.o. adult who is here today for cc of  Chief Complaint  Patient presents with  . Sinusitis    x 1 wk/ chest congestion  . Fatigue    in the am   Reflux appears under treated.  Advised to continue the omeprazole OTC he is taking, and adding h2 blocker.   I am stopping the Flonase, and starting Nasacort.   If he is not having symptoms relief in 2 weeks, we should consider starting Singulair.   Multiple allergies - Plan:  triamcinolone (NASACORT) 55 MCG/ACT AERO nasal inhaler, CBC, CMP14+EGFR  Gastroesophageal reflux disease, esophagitis presence not specified - Plan: ranitidine (ZANTAC) 150 MG tablet  Ivar Drape, PA-C [facility] 11/16/20189:07 AM

## 2017-10-28 NOTE — Patient Instructions (Addendum)
Please follow up in 2 weeks.   We will stop the flonase, and start the nasacort. You can start the zantac as well.  This will help with the reflux.   We need to make sure that this is controlled.   Food Choices for Gastroesophageal Reflux Disease, Adult When you have gastroesophageal reflux disease (GERD), the foods you eat and your eating habits are very important. Choosing the right foods can help ease your discomfort. What guidelines do I need to follow?  Choose fruits, vegetables, whole grains, and low-fat dairy products.  Choose low-fat meat, fish, and poultry.  Limit fats such as oils, salad dressings, butter, nuts, and avocado.  Keep a food diary. This helps you identify foods that cause symptoms.  Avoid foods that cause symptoms. These may be different for everyone.  Eat small meals often instead of 3 large meals a day.  Eat your meals slowly, in a place where you are relaxed.  Limit fried foods.  Cook foods using methods other than frying.  Avoid drinking alcohol.  Avoid drinking large amounts of liquids with your meals.  Avoid bending over or lying down until 2-3 hours after eating. What foods are not recommended? These are some foods and drinks that may make your symptoms worse: Vegetables Tomatoes. Tomato juice. Tomato and spaghetti sauce. Chili peppers. Onion and garlic. Horseradish. Fruits Oranges, grapefruit, and lemon (fruit and juice). Meats High-fat meats, fish, and poultry. This includes hot dogs, ribs, ham, sausage, salami, and bacon. Dairy Whole milk and chocolate milk. Sour cream. Cream. Butter. Ice cream. Cream cheese. Drinks Coffee and tea. Bubbly (carbonated) drinks or energy drinks. Condiments Hot sauce. Barbecue sauce. Sweets/Desserts Chocolate and cocoa. Donuts. Peppermint and spearmint. Fats and Oils High-fat foods. This includes [place] fries and potato chips. Other Vinegar. Strong spices. This includes black pepper, white pepper, red  pepper, cayenne, curry powder, cloves, ginger, and chili powder. The items listed above may not be a complete list of foods and drinks to avoid. Contact your dietitian for more information. This information is not intended to replace advice given to you by your health care provider. Make sure you discuss any questions you have with your health care provider. Document Released: 05/31/2012 Document Revised: 05/07/2016 Document Reviewed: 10/04/2013 Elsevier Interactive Patient Education  2017 Elsevier Inc.  DASH Eating Plan DASH stands for "Dietary Approaches to Stop Hypertension." The DASH eating plan is a healthy eating plan that has been shown to reduce high blood pressure (hypertension). It may also reduce your risk for type 2 diabetes, heart disease, and stroke. The DASH eating plan may also help with weight loss. What are tips for following this plan? General guidelines  Avoid eating more than 2,300 mg (milligrams) of salt (sodium) a day. If you have hypertension, you may need to reduce your sodium intake to 1,500 mg a day.  Limit alcohol intake to no more than 1 drink a day for nonpregnant women and 2 drinks a day for men. One drink equals 12 oz of beer, 5 oz of wine, or 1 oz of hard liquor.  Work with your health care provider to maintain a healthy body weight or to lose weight. Ask what an ideal weight is for you.  Get at least 30 minutes of exercise that causes your heart to beat faster (aerobic exercise) most days of the week. Activities may include walking, swimming, or biking.  Work with your health care provider or diet and nutrition specialist (dietitian) to adjust your eating plan to  your individual calorie needs. Reading food labels  Check food labels for the amount of sodium per serving. Choose foods with less than 5 percent of the Daily Value of sodium. Generally, foods with less than 300 mg of sodium per serving fit into this eating plan.  To find whole grains, look for the  word "whole" as the first word in the ingredient list. Shopping  Buy products labeled as "low-sodium" or "no salt added."  Buy fresh foods. Avoid canned foods and premade or frozen meals. Cooking  Avoid adding salt when cooking. Use salt-free seasonings or herbs instead of table salt or sea salt. Check with your health care provider or pharmacist before using salt substitutes.  Do not fry foods. Cook foods using healthy methods such as baking, boiling, grilling, and broiling instead.  Cook with heart-healthy oils, such as olive, canola, soybean, or sunflower oil. Meal planning   Eat a balanced diet that includes: ? 5 or more servings of fruits and vegetables each day. At each meal, try to fill half of your plate with fruits and vegetables. ? Up to 6-8 servings of whole grains each day. ? Less than 6 oz of lean meat, poultry, or fish each day. A 3-oz serving of meat is about the same size as a deck of cards. One egg equals 1 oz. ? 2 servings of low-fat dairy each day. ? A serving of nuts, seeds, or beans 5 times each week. ? Heart-healthy fats. Healthy fats called Omega-3 fatty acids are found in foods such as flaxseeds and coldwater fish, like sardines, salmon, and mackerel.  Limit how much you eat of the following: ? Canned or prepackaged foods. ? Food that is high in trans fat, such as fried foods. ? Food that is high in saturated fat, such as fatty meat. ? Sweets, desserts, sugary drinks, and other foods with added sugar. ? Full-fat dairy products.  Do not salt foods before eating.  Try to eat at least 2 vegetarian meals each week.  Eat more home-cooked food and less restaurant, buffet, and fast food.  When eating at a restaurant, ask that your food be prepared with less salt or no salt, if possible. What foods are recommended? The items listed may not be a complete list. Talk with your dietitian about what dietary choices are best for you. Grains Whole-grain or  whole-wheat bread. Whole-grain or whole-wheat pasta. Brown rice. Orpah Cobbatmeal. Quinoa. Bulgur. Whole-grain and low-sodium cereals. Pita bread. Low-fat, low-sodium crackers. Whole-wheat flour tortillas. Vegetables Fresh or frozen vegetables (raw, steamed, roasted, or grilled). Low-sodium or reduced-sodium tomato and vegetable juice. Low-sodium or reduced-sodium tomato sauce and tomato paste. Low-sodium or reduced-sodium canned vegetables. Fruits All fresh, dried, or frozen fruit. Canned fruit in natural juice (without added sugar). Meat and other protein foods Skinless chicken or Malaw[place]urkey. Ground chicken or [place]urkey. Pork with fat trimmed off. Fish and seafood. Egg whites. Dried beans, peas, or lentils. Unsalted nuts, nut butters, and seeds. Unsalted canned beans. Lean cuts of beef with fat trimmed off. Low-sodium, lean deli meat. Dairy Low-fat (1%) or fat-free (skim) milk. Fat-free, low-fat, or reduced-fat cheeses. Nonfat, low-sodium ricotta or cottage cheese. Low-fat or nonfat yogurt. Low-fat, low-sodium cheese. Fats and oils Soft margarine without trans fats. Vegetable oil. Low-fat, reduced-fat, or light mayonnaise and salad dressings (reduced-sodium). Canola, safflower, olive, soybean, and sunflower oils. Avocado. Seasoning and other foods Herbs. Spices. Seasoning mixes without salt. Unsalted popcorn and pretzels. Fat-free sweets. What foods are not recommended? The items listed may not  be a complete list. Talk with your dietitian about what dietary choices are best for you. Grains Baked goods made with fat, such as croissants, muffins, or some breads. Dry pasta or rice meal packs. Vegetables Creamed or fried vegetables. Vegetables in a cheese sauce. Regular canned vegetables (not low-sodium or reduced-sodium). Regular canned tomato sauce and paste (not low-sodium or reduced-sodium). Regular tomato and vegetable juice (not low-sodium or reduced-sodium). Rosita FirePickles. Olives. Fruits Canned fruit in a light  or heavy syrup. Fried fruit. Fruit in cream or butter sauce. Meat and other protein foods Fatty cuts of meat. Ribs. Fried meat. Tomasa BlaseBacon. Sausage. Bologna and other processed lunch meats. Salami. Fatback. Hotdogs. Bratwurst. Salted nuts and seeds. Canned beans with added salt. Canned or smoked fish. Whole eggs or egg yolks. Chicken or Malaw[place]urkey with skin. Dairy Whole or 2% milk, cream, and half-and-half. Whole or full-fat cream cheese. Whole-fat or sweetened yogurt. Full-fat cheese. Nondairy creamers. Whipped toppings. Processed cheese and cheese spreads. Fats and oils Butter. Stick margarine. Lard. Shortening. Ghee. Bacon fat. Tropical oils, such as coconut, palm kernel, or palm oil. Seasoning and other foods Salted popcorn and pretzels. Onion salt, garlic salt, seasoned salt, table salt, and sea salt. Worcestershire sauce. Tartar sauce. Barbecue sauce. Teriyaki sauce. Soy sauce, including reduced-sodium. Steak sauce. Canned and packaged gravies. Fish sauce. Oyster sauce. Cocktail sauce. Horseradish that you find on the shelf. Ketchup. Mustard. Meat flavorings and tenderizers. Bouillon cubes. Hot sauce and Tabasco sauce. Premade or packaged marinades. Premade or packaged taco seasonings. Relishes. Regular salad dressings. Where to find more information:  National Heart, Lung, and Blood Institute: PopSteam.iswww.nhlbi.nih.gov  American Heart Association: www.heart.org Summary  The DASH eating plan is a healthy eating plan that has been shown to reduce high blood pressure (hypertension). It may also reduce your risk for type 2 diabetes, heart disease, and stroke.  With the DASH eating plan, you should limit salt (sodium) intake to 2,300 mg a day. If you have hypertension, you may need to reduce your sodium intake to 1,500 mg a day.  When on the DASH eating plan, aim to eat more fresh fruits and vegetables, whole grains, lean proteins, low-fat dairy, and heart-healthy fats.  Work with your health care provider or  diet and nutrition specialist (dietitian) to adjust your eating plan to your individual calorie needs. This information is not intended to replace advice given to you by your health care provider. Make sure you discuss any questions you have with your health care provider. Document Released: 11/19/2011 Document Revised: 11/23/2016 Document Reviewed: 11/23/2016 Elsevier Interactive Patient Education  2017 [employer]     IF you received an x-ray today, you will receive an invoice from [facility]. Please contact [facility] at [phone] with questions or concerns regarding your invoice.   IF you received labwork today, you will receive an invoice from [facility]. Please contact [facility] at [phone] with questions or concerns regarding your invoice.   Our billing staff will not be able to assist you with questions regarding bills from these companies.  You will be contacted with the lab results as soon as they are available. The fastest way to get your results is to activate your My Chart account. Instructions are located on the last page of this paperwork. If you have not heard from us regarding the results in 2 weeks, please contact this office.

## 2017-10-29 LAB — CMP14+EGFR
A/G RATIO: 1.6 (ref 1.2–2.2)
ALBUMIN: 4.5 g/dL (ref 3.5–5.5)
ALT: 28 IU/L (ref 0–44)
AST: 24 IU/L (ref 0–40)
Alkaline Phosphatase: 85 IU/L (ref 39–117)
BILIRUBIN TOTAL: 0.3 mg/dL (ref 0.0–1.2)
BUN/Creatinine Ratio: 9 (ref 9–20)
BUN: 12 mg/dL (ref 6–24)
CHLORIDE: 104 mmol/L (ref 96–106)
CO2: 21 mmol/L (ref 20–29)
Calcium: 8.8 mg/dL (ref 8.7–10.2)
Creatinine, Ser: 1.4 mg/dL — ABNORMAL HIGH (ref 0.76–1.27)
GFR, EST AFRICAN AMERICAN: 65 mL/min/{1.73_m2} (ref >59)
GFR, EST NON AFRICAN AMERICAN: 57 mL/min/{1.73_m2} — AB (ref >59)
Globulin, Total: 2.8 g/dL (ref 1.5–4.5)
Glucose: 109 mg/dL — ABNORMAL HIGH (ref 65–99)
POTASSIUM: 3.5 mmol/L (ref 3.5–5.2)
Sodium: 142 mmol/L (ref 134–144)
TOTAL PROTEIN: 7.3 g/dL (ref 6.0–8.5)

## 2017-10-29 LAB — CBC
HEMOGLOBIN: 15.2 g/dL (ref 13.0–17.7)
Hematocrit: 44 % (ref 37.5–51.0)
MCH: 29.3 pg (ref 26.6–33.0)
MCHC: 34.5 g/dL (ref 31.5–35.7)
MCV: 85 fL (ref 79–97)
Platelets: 197 10*3/uL (ref 150–379)
RBC: 5.19 x10E6/uL (ref 4.14–5.80)
RDW: 14.4 % (ref 12.3–15.4)
WBC: 4.3 10*3/uL (ref 3.4–10.8)

## 2017-11-03 DIAGNOSIS — J3081 Allergic rhinitis due to animal (cat) (dog) hair and dander: Secondary | ICD-10-CM | POA: Diagnosis not present

## 2017-11-03 DIAGNOSIS — J3089 Other allergic rhinitis: Secondary | ICD-10-CM | POA: Diagnosis not present

## 2017-11-03 DIAGNOSIS — J301 Allergic rhinitis due to pollen: Secondary | ICD-10-CM | POA: Diagnosis not present

## 2017-11-05 ENCOUNTER — Encounter: Payer: Self-pay | Admitting: Physician Assistant

## 2017-11-11 ENCOUNTER — Ambulatory Visit: Payer: 59 | Admitting: Physician Assistant

## 2017-11-12 ENCOUNTER — Encounter: Payer: Self-pay | Admitting: Physician Assistant

## 2017-11-12 ENCOUNTER — Ambulatory Visit: Payer: 59 | Admitting: Physician Assistant

## 2017-11-12 VITALS — BP 172/82 | HR 88 | Temp 98.3°F | Resp 16 | Ht 67.0 in | Wt 213.0 lb

## 2017-11-12 DIAGNOSIS — J3081 Allergic rhinitis due to animal (cat) (dog) hair and dander: Secondary | ICD-10-CM | POA: Diagnosis not present

## 2017-11-12 DIAGNOSIS — J3089 Other allergic rhinitis: Secondary | ICD-10-CM | POA: Diagnosis not present

## 2017-11-12 DIAGNOSIS — Z79899 Other long term (current) drug therapy: Secondary | ICD-10-CM

## 2017-11-12 DIAGNOSIS — I10 Essential (primary) hypertension: Secondary | ICD-10-CM

## 2017-11-12 DIAGNOSIS — J301 Allergic rhinitis due to pollen: Secondary | ICD-10-CM | POA: Diagnosis not present

## 2017-11-12 NOTE — Patient Instructions (Signed)
     IF you received an x-ray today, you will receive an invoice from [facility]. Please contact [facility] at [phone] with questions or concerns regarding your invoice.   IF you received labwork today, you will receive an invoice from [facility]. Please contact [facility] at [phone] with questions or concerns regarding your invoice.   Our billing staff will not be able to assist you with questions regarding bills from these companies.  You will be contacted with the lab results as soon as they are available. The fastest way to get your results is to activate your My Chart account. Instructions are located on the last page of this paperwork. If you have not heard from us regarding the results in 2 weeks, please contact this office.     

## 2017-11-12 NOTE — Progress Notes (Signed)
[facility] [address], [place] [phone]  Date:  11/12/2017   Name:  Jesse Horton   DOB:  03/27/1961   MRN:  [id number]  PCP:  Patient, No Pcp Per    History of Present Illness:  Jesse Horton is a 56 y.o. adult patient who presents to PCP with  Chief Complaint  Patient presents with  . Follow-up    BP/ and allergy. pt has not been taking BP meds bc pharmacy will not cover     Reports improvement of allergies with the medication to [facility]MA.  Continues to not be taking his blood pressure medicine, on concern of cost.  I have contacted his pharmacy today.  Notes that the cost is 16.00.  I found discounted pricing on the amlodipine.  Printed goodrx and patient was given print for pharmacy for increased discount.  He notes that this he can afford.  He will return for bp recheck.  He has no chest pains, palpitations, or sob.   10 minutes of coordination of medication management, and access to treatment given today for patient.   Medication management  Essential hypertension  Trena PlattStephanie Arta Stump, PA-C [facility] 12/4/20189:24 AM

## 2017-11-19 DIAGNOSIS — J3089 Other allergic rhinitis: Secondary | ICD-10-CM | POA: Diagnosis not present

## 2017-11-19 DIAGNOSIS — J301 Allergic rhinitis due to pollen: Secondary | ICD-10-CM | POA: Diagnosis not present

## 2017-11-19 DIAGNOSIS — J3081 Allergic rhinitis due to animal (cat) (dog) hair and dander: Secondary | ICD-10-CM | POA: Diagnosis not present

## 2017-11-26 DIAGNOSIS — J3089 Other allergic rhinitis: Secondary | ICD-10-CM | POA: Diagnosis not present

## 2017-11-26 DIAGNOSIS — J301 Allergic rhinitis due to pollen: Secondary | ICD-10-CM | POA: Diagnosis not present

## 2017-11-26 DIAGNOSIS — J3081 Allergic rhinitis due to animal (cat) (dog) hair and dander: Secondary | ICD-10-CM | POA: Diagnosis not present

## 2017-12-03 ENCOUNTER — Encounter: Payer: Self-pay | Admitting: Physician Assistant

## 2017-12-03 DIAGNOSIS — J3081 Allergic rhinitis due to animal (cat) (dog) hair and dander: Secondary | ICD-10-CM | POA: Diagnosis not present

## 2017-12-03 DIAGNOSIS — J301 Allergic rhinitis due to pollen: Secondary | ICD-10-CM | POA: Diagnosis not present

## 2017-12-03 DIAGNOSIS — J3089 Other allergic rhinitis: Secondary | ICD-10-CM | POA: Diagnosis not present

## 2017-12-06 ENCOUNTER — Encounter: Payer: Self-pay | Admitting: Physician Assistant

## 2017-12-06 ENCOUNTER — Other Ambulatory Visit: Payer: Self-pay | Admitting: Physician Assistant

## 2017-12-06 DIAGNOSIS — K219 Gastro-esophageal reflux disease without esophagitis: Secondary | ICD-10-CM

## 2017-12-09 ENCOUNTER — Encounter: Payer: Self-pay | Admitting: Physician Assistant

## 2017-12-09 DIAGNOSIS — J3081 Allergic rhinitis due to animal (cat) (dog) hair and dander: Secondary | ICD-10-CM | POA: Diagnosis not present

## 2017-12-09 DIAGNOSIS — J301 Allergic rhinitis due to pollen: Secondary | ICD-10-CM | POA: Diagnosis not present

## 2017-12-09 DIAGNOSIS — J3089 Other allergic rhinitis: Secondary | ICD-10-CM | POA: Diagnosis not present

## 2017-12-09 MED ORDER — RANITIDINE HCL 150 MG PO TABS: 150.0000 mg | ORAL_TABLET | Freq: Two times a day (BID) | ORAL | 0 refills | Status: DC

## 2017-12-10 NOTE — Telephone Encounter (Signed)
This really is a message that should have been intervened.  I have never given him this nor discussed it.

## 2017-12-17 DIAGNOSIS — J3081 Allergic rhinitis due to animal (cat) (dog) hair and dander: Secondary | ICD-10-CM | POA: Diagnosis not present

## 2017-12-17 DIAGNOSIS — J301 Allergic rhinitis due to pollen: Secondary | ICD-10-CM | POA: Diagnosis not present

## 2017-12-17 DIAGNOSIS — J3089 Other allergic rhinitis: Secondary | ICD-10-CM | POA: Diagnosis not present

## 2017-12-31 DIAGNOSIS — J3081 Allergic rhinitis due to animal (cat) (dog) hair and dander: Secondary | ICD-10-CM | POA: Diagnosis not present

## 2017-12-31 DIAGNOSIS — J301 Allergic rhinitis due to pollen: Secondary | ICD-10-CM | POA: Diagnosis not present

## 2017-12-31 DIAGNOSIS — J3089 Other allergic rhinitis: Secondary | ICD-10-CM | POA: Diagnosis not present

## 2018-01-04 ENCOUNTER — Ambulatory Visit: Payer: 59 | Admitting: Urgent Care

## 2018-01-04 ENCOUNTER — Encounter: Payer: Self-pay | Admitting: Urgent Care

## 2018-01-04 VITALS — BP 165/95 | HR 103 | Temp 98.4°F | Resp 18 | Ht 67.0 in | Wt 216.0 lb

## 2018-01-04 DIAGNOSIS — I10 Essential (primary) hypertension: Secondary | ICD-10-CM

## 2018-01-04 DIAGNOSIS — R05 Cough: Secondary | ICD-10-CM

## 2018-01-04 DIAGNOSIS — R51 Headache: Secondary | ICD-10-CM

## 2018-01-04 DIAGNOSIS — R030 Elevated blood-pressure reading, without diagnosis of hypertension: Secondary | ICD-10-CM

## 2018-01-04 DIAGNOSIS — R059 Cough, unspecified: Secondary | ICD-10-CM

## 2018-01-04 DIAGNOSIS — J069 Acute upper respiratory infection, unspecified: Secondary | ICD-10-CM

## 2018-01-04 DIAGNOSIS — R519 Headache, unspecified: Secondary | ICD-10-CM

## 2018-01-04 LAB — POCT INFLUENZA A/B
Influenza A, POC: NEGATIVE
Influenza B, POC: NEGATIVE

## 2018-01-04 MED ORDER — HYDROCODONE-HOMATROPINE 5-1.5 MG/5ML PO SYRP: 5.0000 mL | ORAL_SOLUTION | Freq: Every evening | ORAL | 0 refills | Status: DC | PRN

## 2018-01-04 MED ORDER — BENZONATATE 100 MG PO CAPS: 100.0000 mg | ORAL_CAPSULE | Freq: Three times a day (TID) | ORAL | 0 refills | Status: DC | PRN

## 2018-01-04 NOTE — Patient Instructions (Addendum)
For sore throat try using a honey-based tea. Use 3 teaspoons of honey with juice squeezed from half lemon. Place shaved pieces of ginger into 1/2-1 cup of water and warm over stove top. Then mix the ingredients and repeat every 4 hours as needed.     Viral Respiratory Infection A respiratory infection is an illness that affects part of the respiratory system, such as the lungs, nose, or throat. Most respiratory infections are caused by either viruses or bacteria. A respiratory infection that is caused by a virus is called a viral respiratory infection. Common types of viral respiratory infections include:  A cold.  The flu (influenza).  A respiratory syncytial virus (RSV) infection.  How do I know if I have a viral respiratory infection? Most viral respiratory infections cause:  A stuffy or runny nose.  Yellow or green nasal discharge.  A cough.  Sneezing.  Fatigue.  Achy muscles.  A sore throat.  Sweating or chills.  A fever.  A headache.  How are viral respiratory infections treated? If influenza is diagnosed early, it may be treated with an antiviral medicine that shortens the length of time a person has symptoms. Symptoms of viral respiratory infections may be treated with over-the-counter and prescription medicines, such as:  Expectorants. These make it easier to cough up mucus.  Decongestant nasal sprays.  Health care providers do not prescribe antibiotic medicines for viral infections. This is because antibiotics are designed to kill bacteria. They have no effect on viruses. How do I know if I should stay home from work or school? To avoid exposing others to your respiratory infection, stay home if you have:  A fever.  A persistent cough.  A sore throat.  A runny nose.  Sneezing.  Muscles aches.  Headaches.  Fatigue.  Weakness.  Chills.  Sweating.  Nausea.  Follow these instructions at home:  Rest as much as possible.  Take  over-the-counter and prescription medicines only as told by your health care provider.  Drink enough fluid to keep your urine clear or pale yellow. This helps prevent dehydration and helps loosen up mucus.  Gargle with a salt-water mixture 3-4 times per day or as needed. To make a salt-water mixture, completely dissolve -1 tsp of salt in 1 cup of warm water.  Use nose drops made from salt water to ease congestion and soften raw skin around your nose.  Do not drink alcohol.  Do not use tobacco products, including cigarettes, chewing tobacco, and e-cigarettes. If you need help quitting, ask your health care provider. Contact a health care provider if:  Your symptoms last for 10 days or longer.  Your symptoms get worse over time.  You have a fever.  You have severe sinus pain in your face or forehead.  The glands in your jaw or neck become very swollen. Get help right away if:  You feel pain or pressure in your chest.  You have shortness of breath.  You faint or feel like you will faint.  You have severe and persistent vomiting.  You feel confused or disoriented. This information is not intended to replace advice given to you by your health care provider. Make sure you discuss any questions you have with your health care provider. Document Released: 09/09/2005 Document Revised: 05/07/2016 Document Reviewed: 05/08/2015 Elsevier Interactive Patient Education  2018 [employer]      Hypertension Hypertension, commonly called high blood pressure, is when the force of blood pumping through the arteries is too strong.  The arteries are the blood vessels that carry blood from the heart throughout the body. Hypertension forces the heart to work harder to pump blood and may cause arteries to become narrow or stiff. Having untreated or uncontrolled hypertension can cause heart attacks, strokes, kidney disease, and other problems. A blood pressure reading consists of a higher number  over a lower number. Ideally, your blood pressure should be below 120/80. The first ("top") number is called the systolic pressure. It is a measure of the pressure in your arteries as your heart beats. The second ("bottom") number is called the diastolic pressure. It is a measure of the pressure in your arteries as the heart relaxes. What are the causes? The cause of this condition is not known. What increases the risk? Some risk factors for high blood pressure are under your control. Others are not. Factors you can change  Smoking.  Having type 2 diabetes mellitus, high cholesterol, or both.  Not getting enough exercise or physical activity.  Being overweight.  Having too much fat, sugar, calories, or salt (sodium) in your diet.  Drinking too much alcohol. Factors that are difficult or impossible to change  Having chronic kidney disease.  Having a family history of high blood pressure.  Age. Risk increases with age.  Race. You may be at higher risk if you are African-American.  Gender. Men are at higher risk than women before age 62. After age 5, women are at higher risk than men.  Having obstructive sleep apnea.  Stress. What are the signs or symptoms? Extremely high blood pressure (hypertensive crisis) may cause:  Headache.  Anxiety.  Shortness of breath.  Nosebleed.  Nausea and vomiting.  Severe chest pain.  Jerky movements you cannot control (seizures).  How is this diagnosed? This condition is diagnosed by measuring your blood pressure while you are seated, with your arm resting on a surface. The cuff of the blood pressure monitor will be placed directly against the skin of your upper arm at the level of your heart. It should be measured at least twice using the same arm. Certain conditions can cause a difference in blood pressure between your right and left arms. Certain factors can cause blood pressure readings to be lower or higher than normal (elevated)  for a short period of time:  When your blood pressure is higher when you are in a health care provider's office than when you are at home, this is called white coat hypertension. Most people with this condition do not need medicines.  When your blood pressure is higher at home than when you are in a health care provider's office, this is called masked hypertension. Most people with this condition may need medicines to control blood pressure.  If you have a high blood pressure reading during one visit or you have normal blood pressure with other risk factors:  You may be asked to return on a different day to have your blood pressure checked again.  You may be asked to monitor your blood pressure at home for 1 week or longer.  If you are diagnosed with hypertension, you may have other blood or imaging tests to help your health care provider understand your overall risk for other conditions. How is this treated? This condition is treated by making healthy lifestyle changes, such as eating healthy foods, exercising more, and reducing your alcohol intake. Your health care provider may prescribe medicine if lifestyle changes are not enough to get your blood pressure under control,  and if:  Your systolic blood pressure is above 130.  Your diastolic blood pressure is above 80.  Your personal target blood pressure may vary depending on your medical conditions, your age, and other factors. Follow these instructions at home: Eating and drinking  Eat a diet that is high in fiber and potassium, and low in sodium, added sugar, and fat. An example eating plan is called the DASH (Dietary Approaches to Stop Hypertension) diet. To eat this way: ? Eat plenty of fresh fruits and vegetables. Try to fill half of your plate at each meal with fruits and vegetables. ? Eat whole grains, such as whole wheat pasta, brown rice, or whole grain bread. Fill about one quarter of your plate with whole grains. ? Eat or  drink low-fat dairy products, such as skim milk or low-fat yogurt. ? Avoid fatty cuts of meat, processed or cured meats, and poultry with skin. Fill about one quarter of your plate with lean proteins, such as fish, chicken without skin, beans, eggs, and tofu. ? Avoid premade and processed foods. These tend to be higher in sodium, added sugar, and fat.  Reduce your daily sodium intake. Most people with hypertension should eat less than 1,500 mg of sodium a day.  Limit alcohol intake to no more than 1 drink a day for nonpregnant women and 2 drinks a day for men. One drink equals 12 oz of beer, 5 oz of wine, or 1 oz of hard liquor. Lifestyle  Work with your health care provider to maintain a healthy body weight or to lose weight. Ask what an ideal weight is for you.  Get at least 30 minutes of exercise that causes your heart to beat faster (aerobic exercise) most days of the week. Activities may include walking, swimming, or biking.  Include exercise to strengthen your muscles (resistance exercise), such as pilates or lifting weights, as part of your weekly exercise routine. Try to do these types of exercises for 30 minutes at least 3 days a week.  Do not use any products that contain nicotine or tobacco, such as cigarettes and e-cigarettes. If you need help quitting, ask your health care provider.  Monitor your blood pressure at home as told by your health care provider.  Keep all follow-up visits as told by your health care provider. This is important. Medicines  Take over-the-counter and prescription medicines only as told by your health care provider. Follow directions carefully. Blood pressure medicines must be taken as prescribed.  Do not skip doses of blood pressure medicine. Doing this puts you at risk for problems and can make the medicine less effective.  Ask your health care provider about side effects or reactions to medicines that you should watch for. Contact a health care  provider if:  You think you are having a reaction to a medicine you are taking.  You have headaches that keep coming back (recurring).  You feel dizzy.  You have swelling in your ankles.  You have trouble with your vision. Get help right away if:  You develop a severe headache or confusion.  You have unusual weakness or numbness.  You feel faint.  You have severe pain in your chest or abdomen.  You vomit repeatedly.  You have trouble breathing. Summary  Hypertension is when the force of blood pumping through your arteries is too strong. If this condition is not controlled, it may put you at risk for serious complications.  Your personal target blood pressure may vary depending  on your medical conditions, your age, and other factors. For most people, a normal blood pressure is less than 120/80.  Hypertension is treated with lifestyle changes, medicines, or a combination of both. Lifestyle changes include weight loss, eating a healthy, low-sodium diet, exercising more, and limiting alcohol. This information is not intended to replace advice given to you by your health care provider. Make sure you discuss any questions you have with your health care provider. Document Released: 11/30/2005 Document Revised: 10/28/2016 Document Reviewed: 10/28/2016 Elsevier Interactive Patient Education  2018 [employer].      IF you received an x-ray today, you will receive an invoice from [facility]. Please contact [facility] at [phone] with questions or concerns regarding your invoice.   IF you received labwork today, you will receive an invoice from [place]. Please contact [facility] at [phone] with questions or concerns regarding your invoice.   Our billing staff will not be able to assist you with questions regarding bills from these companies.  You will be contacted with the lab results as soon as they are available. The fastest way to get your results is  to activate your My Chart account. Instructions are located on the last page of this paperwork. If you have not heard from [place] regarding the results in 2 weeks, please contact this office.

## 2018-01-04 NOTE — Progress Notes (Signed)
  MRN: [id number] DOB: 07/28/1961  Subjective:   Jesse Horton is a 57 y.o. adult presenting for 2 day history of intermittently productive cough, headache, subjective fever, body aches, nasal congestion, ear fullness, shob, occasional wheezing. Has tried Thera-flu, NyQuil. Has allergies, takes allergy shots every week. Denies sinus pain, ear pain, ear drainage, chest pain, n/v, abdominal pain, rashes. Patient did get flu shot this season 10/05/2017. Denies smoking cigarettes. Denies history of asthma.   Jesse Horton has a current medication list which includes the following prescription(s): albuterol, ranitidine, sildenafil, triamcinolone, and amlodipine. Also has No Known Allergies.  Jesse Horton  has no past medical history on file. Also  has no past surgical history on file.  Objective:   Vitals: BP (!) 165/95   Pulse (!) 103   Temp 98.4 F (36.9 C) (Oral)   Resp 18   Ht 5\' 7"  (1.702 m)   Wt 216 lb (98 kg)   SpO2 96%   BMI 33.83 kg/m   BP Readings from Last 3 Encounters:  01/04/18 (!) 165/95  11/12/17 (!) 172/82  10/28/17 (!) 174/88    Physical Exam  Constitutional: He is oriented to person, place, and time. He appears well-developed and well-nourished.  HENT:  TM's intact bilaterally, no effusions or erythema. Nasal turbinates erythematous, dry, nasal passages moderately patent. No sinus tenderness. Oropharynx with mild post-nasal drainage but no exudates, mucous membranes moist.   Eyes: Right eye exhibits no discharge. Left eye exhibits no discharge.  Neck: Normal range of motion. Neck supple.  Cardiovascular: Normal rate, regular rhythm and intact distal pulses. Exam reveals no gallop and no friction rub.  No murmur heard. Pulmonary/Chest: No respiratory distress. He has no wheezes. He has no rales.  Musculoskeletal: He exhibits no edema (trace up to mid lower legs bilaterally).  Lymphadenopathy:    He has no cervical adenopathy.  Neurological: He is alert and oriented to  person, place, and time.  Skin: Skin is warm and dry.   Results for orders placed or performed in visit on 01/04/18 (from the past 24 hour(s))  POCT Influenza A/B     Status: None   Collection Time: 01/04/18 10:50 AM  Result Value Ref Range   Influenza A, POC Negative Negative   Influenza B, POC Negative Negative    Assessment and Plan :   Fever, unspecified - Plan: POCT Influenza A/B  Cough  Acute nonintractable headache, unspecified headache type  Essential hypertension  Elevated blood pressure reading  Likely undergoing viral URI, recommended supportive care. Patient has an appointment with PA-English for continued management of his blood pressure. Return-to-clinic precautions discussed, patient verbalized understanding.   Jesse BambergMario Isahi Godwin, PA-C [facility] [phone] 01/04/2018  10:33 AM

## 2018-01-07 ENCOUNTER — Ambulatory Visit: Payer: 59 | Admitting: Physician Assistant

## 2018-01-07 ENCOUNTER — Encounter: Payer: Self-pay | Admitting: Physician Assistant

## 2018-01-07 VITALS — BP 138/84 | HR 98 | Temp 98.5°F | Resp 17 | Ht 69.0 in | Wt 213.0 lb

## 2018-01-07 DIAGNOSIS — I10 Essential (primary) hypertension: Secondary | ICD-10-CM

## 2018-01-07 DIAGNOSIS — J3089 Other allergic rhinitis: Secondary | ICD-10-CM | POA: Diagnosis not present

## 2018-01-07 DIAGNOSIS — J3081 Allergic rhinitis due to animal (cat) (dog) hair and dander: Secondary | ICD-10-CM | POA: Diagnosis not present

## 2018-01-07 DIAGNOSIS — J301 Allergic rhinitis due to pollen: Secondary | ICD-10-CM | POA: Diagnosis not present

## 2018-01-07 LAB — BASIC METABOLIC PANEL
BUN/Creatinine Ratio: 10 (ref 9–20)
BUN: 13 mg/dL (ref 6–24)
CALCIUM: 9.3 mg/dL (ref 8.7–10.2)
CHLORIDE: 99 mmol/L (ref 96–106)
CO2: 22 mmol/L (ref 20–29)
Creatinine, Ser: 1.35 mg/dL — ABNORMAL HIGH (ref 0.76–1.27)
GFR calc Af Amer: 68 mL/min/{1.73_m2} (ref >59)
GFR calc non Af Amer: 59 mL/min/{1.73_m2} — ABNORMAL LOW (ref >59)
Glucose: 111 mg/dL — ABNORMAL HIGH (ref 65–99)
Potassium: 3.5 mmol/L (ref 3.5–5.2)
SODIUM: 136 mmol/L (ref 134–144)

## 2018-01-07 NOTE — Progress Notes (Deleted)
[facility] [address], [place] [phone]  Date:  01/07/2018   Name:  Jesse Horton   DOB:  03/17/1961   MRN:  [id number]  PCP:  Patient, No Pcp Per    History of Present Illness:  Jesse Horton is a 57 y.o. adult patient who presents to PCP with  Chief Complaint  Patient presents with  . Follow-up  . Hypertension     Patient is here t   Patient Active Problem List   Diagnosis Date Noted  . Erectile dysfunction due to arterial insufficiency 01/09/2015  . Elevated blood pressure 01/09/2015  . HTN (hypertension) 07/29/2012    No past medical history on file.  No past surgical history on file.  Social History   Tobacco Use  . Smoking status: Former Smoker    Last attempt to quit: 12/29/1998    Years since quitting: 19.0  . Smokeless tobacco: Never Used  Substance Use Topics  . Alcohol use: No  . Drug use: No    No family history on file.  No Known Allergies  Medication list has been reviewed and updated.  Current Outpatient Medications on File Prior to Visit  Medication Sig Dispense Refill  . albuterol (VENTOLIN HFA) 108 (90 Base) MCG/ACT inhaler Inhale 2 puffs into the lungs every 6 (six) hours as needed for wheezing or shortness of breath. 6.7 g 5  . amLODipine (NORVASC) 5 MG tablet Take 1 tablet (5 mg total) by mouth daily. 90 tablet 1  . benzonatate (TESSALON) 100 MG capsule Take 1-2 capsules (100-200 mg total) by mouth 3 (three) times daily as needed. 60 capsule 0  . HYDROcodone-homatropine (HYCODAN) 5-1.5 MG/5ML syrup Take 5 mLs by mouth at bedtime as needed. 50 mL 0  . ranitidine (ZANTAC) 150 MG tablet Take 1 tablet (150 mg total) by mouth 2 (two) times daily. 60 tablet 0  . sildenafil (VIAGRA) 50 MG tablet Take 1 tablet (50 mg total) by mouth as needed for erectile dysfunction. Needs office visit 10 tablet 1  . triamcinolone (NASACORT) 55 MCG/ACT AERO nasal inhaler Place 2 sprays daily into the nose. 1 Inhaler 12    No current facility-administered medications on file prior to visit.     ROS ROS otherwise unremarkable unless listed above.  Physical Examination: BP 138/84   Pulse 98   Temp 98.5 F (36.9 C) (Oral)   Resp 17   Ht 5\' 9"  (1.753 m)   Wt 213 lb (96.6 kg)   SpO2 98%   BMI 31.45 kg/m  Ideal Body Weight: Weight in (lb) to have BMI = 25: 168.9  Physical Exam   Assessment and Plan: Jesse Horton is a 57 y.o. adult who is here today  There are no diagnoses linked to this encounter.  Trena PlattStephanie English, PA-C [facility] 01/07/2018 9:58 AM

## 2018-01-07 NOTE — Patient Instructions (Addendum)
I am glad you are feeling better. Your blood pressure looks much better.  Please make sure that you are taking your blood pressure medication every day.  I want you to review the dash diet below.   We need to get a lipid panel, like I told you before, so make sure you are fasting prior to your next visit.  Please exercise 4 times per week with 30 minutes of aerobic exercise.  DASH Eating Plan DASH stands for "Dietary Approaches to Stop Hypertension." The DASH eating plan is a healthy eating plan that has been shown to reduce high blood pressure (hypertension). It may also reduce your risk for type 2 diabetes, heart disease, and stroke. The DASH eating plan may also help with weight loss. What are tips for following this plan? General guidelines  Avoid eating more than 2,300 mg (milligrams) of salt (sodium) a day. If you have hypertension, you may need to reduce your sodium intake to 1,500 mg a day.  Limit alcohol intake to no more than 1 drink a day for nonpregnant women and 2 drinks a day for men. One drink equals 12 oz of beer, 5 oz of wine, or 1 oz of hard liquor.  Work with your health care provider to maintain a healthy body weight or to lose weight. Ask what an ideal weight is for you.  Get at least 30 minutes of exercise that causes your heart to beat faster (aerobic exercise) most days of the week. Activities may include walking, swimming, or biking.  Work with your health care provider or diet and nutrition specialist (dietitian) to adjust your eating plan to your individual calorie needs. Reading food labels  Check food labels for the amount of sodium per serving. Choose foods with less than 5 percent of the Daily Value of sodium. Generally, foods with less than 300 mg of sodium per serving fit into this eating plan.  To find whole grains, look for the word "whole" as the first word in the ingredient list. Shopping  Buy products labeled as "low-sodium" or "no salt added."  Buy  fresh foods. Avoid canned foods and premade or frozen meals. Cooking  Avoid adding salt when cooking. Use salt-free seasonings or herbs instead of table salt or sea salt. Check with your health care provider or pharmacist before using salt substitutes.  Do not fry foods. Cook foods using healthy methods such as baking, boiling, grilling, and broiling instead.  Cook with heart-healthy oils, such as olive, canola, soybean, or sunflower oil. Meal planning   Eat a balanced diet that includes: ? 5 or more servings of fruits and vegetables each day. At each meal, try to fill half of your plate with fruits and vegetables. ? Up to 6-8 servings of whole grains each day. ? Less than 6 oz of lean meat, poultry, or fish each day. A 3-oz serving of meat is about the same size as a deck of cards. One egg equals 1 oz. ? 2 servings of low-fat dairy each day. ? A serving of nuts, seeds, or beans 5 times each week. ? Heart-healthy fats. Healthy fats called Omega-3 fatty acids are found in foods such as flaxseeds and coldwater fish, like sardines, salmon, and mackerel.  Limit how much you eat of the following: ? Canned or prepackaged foods. ? Food that is high in trans fat, such as fried foods. ? Food that is high in saturated fat, such as fatty meat. ? Sweets, desserts, sugary drinks, and other foods  with added sugar. ? Full-fat dairy products.  Do not salt foods before eating.  Try to eat at least 2 vegetarian meals each week.  Eat more home-cooked food and less restaurant, buffet, and fast food.  When eating at a restaurant, ask that your food be prepared with less salt or no salt, if possible. What foods are recommended? The items listed may not be a complete list. Talk with your dietitian about what dietary choices are best for you. Grains Whole-grain or whole-wheat bread. Whole-grain or whole-wheat pasta. Brown rice. [place]row. Bulgur. Whole-grain and low-sodium cereals. Pita bread.  Low-fat, low-sodium crackers. Whole-wheat flour tortillas. Vegetables Fresh or frozen vegetables (raw, steamed, roasted, or grilled). Low-sodium or reduced-sodium tomato and vegetable juice. Low-sodium or reduced-sodium tomato sauce and tomato paste. Low-sodium or reduced-sodium canned vegetables. Fruits All fresh, dried, or frozen fruit. Canned fruit in natural juice (without added sugar). Meat and other protein foods Skinless chicken or [place]. Ground chicken or [place]. Pork with fat trimmed off. Fish and seafood. Egg whites. Dried beans, peas, or lentils. Unsalted nuts, nut butters, and seeds. Unsalted canned beans. Lean cuts of beef with fat trimmed off. Low-sodium, lean deli meat. Dairy Low-fat (1%) or fat-free (skim) milk. Fat-free, low-fat, or reduced-fat cheeses. Nonfat, low-sodium ricotta or cottage cheese. Low-fat or nonfat yogurt. Low-fat, low-sodium cheese. Fats and oils Soft margarine without trans fats. Vegetable oil. Low-fat, reduced-fat, or light mayonnaise and salad dressings (reduced-sodium). Canola, safflower, olive, soybean, and sunflower oils. Avocado. Seasoning and other foods Herbs. Spices. Seasoning mixes without salt. Unsalted popcorn and pretzels. Fat-free sweets. What foods are not recommended? The items listed may not be a complete list. Talk with your dietitian about what dietary choices are best for you. Grains Baked goods made with fat, such as croissants, muffins, or some breads. Dry pasta or rice meal packs. Vegetables Creamed or fried vegetables. Vegetables in a cheese sauce. Regular canned vegetables (not low-sodium or reduced-sodium). Regular canned tomato sauce and paste (not low-sodium or reduced-sodium). Regular tomato and vegetable juice (not low-sodium or reduced-sodium). Angie Fava. Olives. Fruits Canned fruit in a light or heavy syrup. Fried fruit. Fruit in cream or butter sauce. Meat and other protein foods Fatty cuts of meat. Ribs. Fried meat. Berniece Salines.  Sausage. Bologna and other processed lunch meats. Salami. Fatback. Hotdogs. Bratwurst. Salted nuts and seeds. Canned beans with added salt. Canned or smoked fish. Whole eggs or egg yolks. Chicken or [place] with skin. Dairy Whole or 2% milk, cream, and half-and-half. Whole or full-fat cream cheese. Whole-fat or sweetened yogurt. Full-fat cheese. Nondairy creamers. Whipped toppings. Processed cheese and cheese spreads. Fats and oils Butter. Stick margarine. Lard. Shortening. Ghee. Bacon fat. Tropical oils, such as coconut, palm kernel, or palm oil. Seasoning and other foods Salted popcorn and pretzels. Onion salt, garlic salt, seasoned salt, table salt, and sea salt. Worcestershire sauce. Tartar sauce. Barbecue sauce. Teriyaki sauce. Soy sauce, including reduced-sodium. Steak sauce. Canned and packaged gravies. Fish sauce. Oyster sauce. Cocktail sauce. Horseradish that you find on the shelf. Ketchup. Mustard. Meat flavorings and tenderizers. Bouillon cubes. Hot sauce and Tabasco sauce. Premade or packaged marinades. Premade or packaged taco seasonings. Relishes. Regular salad dressings. Where to find more information:  National Heart, Lung, and [place]: https://wilson-eaton.com/  American Heart Association: www.heart.org Summary  The DASH eating plan is a healthy eating plan that has been shown to reduce high blood pressure (hypertension). It may also reduce your risk for type 2 diabetes, heart disease, and stroke.  With the  DASH eating plan, you should limit salt (sodium) intake to 2,300 mg a day. If you have hypertension, you may need to reduce your sodium intake to 1,500 mg a day.  When on the DASH eating plan, aim to eat more fresh fruits and vegetables, whole grains, lean proteins, low-fat dairy, and heart-healthy fats.  Work with your health care provider or diet and nutrition specialist (dietitian) to adjust your eating plan to your individual calorie needs. This information is not intended  to replace advice given to you by your health care provider. Make sure you discuss any questions you have with your health care provider. Document Released: 11/19/2011 Document Revised: 11/23/2016 Document Reviewed: 11/23/2016 Elsevier Interactive Patient Education  2018 [employer]     IF you received an x-ray today, you will receive an invoice from [facility]. Please contact [facility] at [phone] with questions or concerns regarding your invoice.   IF you received labwork today, you will receive an invoice from [facility]. Please contact [facility] at [phone] with questions or concerns regarding your invoice.   Our billing staff will not be able to assist you with questions regarding bills from these companies.  You will be contacted with the lab results as soon as they are available. The fastest way to get your results is to activate your My Chart account. Instructions are located on the last page of this paperwork. If you have not heard from us regarding the results in 2 weeks, please contact this office.

## 2018-01-07 NOTE — Progress Notes (Signed)
PRIMARY CARE AT [facility] [address] [place] [address] [phone]  Date:  01/07/2018   Name:  Jesse Horton   DOB:  1961-05-07   MRN:  [id number]  PCP:  Patient, No Pcp Per    History of Present Illness:  Jesse Horton is a 57 y.o. adult patient who presents to PCP with  Chief Complaint  Patient presents with  . Follow-up  . Hypertension     He is eating salads and managing his hypertension.  He is eating salmon.   He is working hard with work .   He is not eating canned foods.  He will eat frozen dinners.  He eats pastas.   He is exercising with doing pushups.   No chest pains, palpitations, sob, or leg swelling.   No sob or dyspnea.   Patient Active Problem List   Diagnosis Date Noted  . Erectile dysfunction due to arterial insufficiency 01/09/2015  . Elevated blood pressure 01/09/2015  . HTN (hypertension) 07/29/2012    No past medical history on file.  No past surgical history on file.  Social History   Tobacco Use  . Smoking status: Former Smoker    Last attempt to quit: 12/29/1998    Years since quitting: 19.0  . Smokeless tobacco: Never Used  Substance Use Topics  . Alcohol use: No  . Drug use: No    No family history on file.  No Known Allergies  Medication list has been reviewed and updated.  Current Outpatient Medications on File Prior to Visit  Medication Sig Dispense Refill  . albuterol (VENTOLIN HFA) 108 (90 Base) MCG/ACT inhaler Inhale 2 puffs into the lungs every 6 (six) hours as needed for wheezing or shortness of breath. 6.7 g 5  . amLODipine (NORVASC) 5 MG tablet Take 1 tablet (5 mg total) by mouth daily. 90 tablet 1  . benzonatate (TESSALON) 100 MG capsule Take 1-2 capsules (100-200 mg total) by mouth 3 (three) times daily as needed. 60 capsule 0  . HYDROcodone-homatropine (HYCODAN) 5-1.5 MG/5ML syrup Take 5 mLs by mouth at bedtime as needed. 50 mL 0  . ranitidine (ZANTAC) 150 MG tablet Take 1 tablet (150 mg total) by mouth 2  (two) times daily. 60 tablet 0  . sildenafil (VIAGRA) 50 MG tablet Take 1 tablet (50 mg total) by mouth as needed for erectile dysfunction. Needs office visit 10 tablet 1  . triamcinolone (NASACORT) 55 MCG/ACT AERO nasal inhaler Place 2 sprays daily into the nose. 1 Inhaler 12   No current facility-administered medications on file prior to visit.     ROS ROS otherwise unremarkable unless listed above.  Physical Examination: BP 138/84   Pulse 98   Temp 98.5 F (36.9 C) (Oral)   Resp 17   Ht 5\' 9"  (1.753 m)   Wt 213 lb (96.6 kg)   SpO2 98%   BMI 31.45 kg/m  Ideal Body Weight: Weight in (lb) to have BMI = 25: 168.9  Physical Exam  Constitutional: He is oriented to person, place, and time. He appears well-developed and well-nourished. No distress.  HENT:  Head: Normocephalic and atraumatic.  Eyes: Conjunctivae and EOM are normal. Pupils are equal, round, and reactive to light.  Cardiovascular: Normal rate, regular rhythm and normal heart sounds. Exam reveals no friction rub.  No murmur heard. Pulmonary/Chest: Effort normal and breath sounds normal. No respiratory distress. He has no wheezes.  Neurological: He is alert and oriented to person, place, and time.  Skin: Skin  is warm and dry. He is not diaphoretic.  Psychiatric: He has a normal mood and affect. His behavior is normal.     Assessment and Plan: Jesse Horton is a 57 y.o. adult who is here today for cc of  Chief Complaint  Patient presents with  . Follow-up  . Hypertension  --he did not fast today.  He is warned to fast at his next visit today. Rtc in 6 months for follow up of bp Essential hypertension - Plan: Basic metabolic panel  Trena PlattStephanie English, PA-C [facility] 2/5/20193:34 PM

## 2018-01-14 ENCOUNTER — Encounter: Payer: Self-pay | Admitting: Physician Assistant

## 2018-01-14 DIAGNOSIS — J3089 Other allergic rhinitis: Secondary | ICD-10-CM | POA: Diagnosis not present

## 2018-01-14 DIAGNOSIS — J301 Allergic rhinitis due to pollen: Secondary | ICD-10-CM | POA: Diagnosis not present

## 2018-01-14 DIAGNOSIS — J3081 Allergic rhinitis due to animal (cat) (dog) hair and dander: Secondary | ICD-10-CM | POA: Diagnosis not present

## 2018-01-17 DIAGNOSIS — J301 Allergic rhinitis due to pollen: Secondary | ICD-10-CM | POA: Diagnosis not present

## 2018-01-18 ENCOUNTER — Encounter: Payer: Self-pay | Admitting: Physician Assistant

## 2018-01-18 DIAGNOSIS — J3089 Other allergic rhinitis: Secondary | ICD-10-CM | POA: Diagnosis not present

## 2018-01-18 DIAGNOSIS — J3081 Allergic rhinitis due to animal (cat) (dog) hair and dander: Secondary | ICD-10-CM | POA: Diagnosis not present

## 2018-01-20 DIAGNOSIS — J3081 Allergic rhinitis due to animal (cat) (dog) hair and dander: Secondary | ICD-10-CM | POA: Diagnosis not present

## 2018-01-20 DIAGNOSIS — J3089 Other allergic rhinitis: Secondary | ICD-10-CM | POA: Diagnosis not present

## 2018-01-20 DIAGNOSIS — J301 Allergic rhinitis due to pollen: Secondary | ICD-10-CM | POA: Diagnosis not present

## 2018-01-24 DIAGNOSIS — J3089 Other allergic rhinitis: Secondary | ICD-10-CM | POA: Diagnosis not present

## 2018-01-24 DIAGNOSIS — J3081 Allergic rhinitis due to animal (cat) (dog) hair and dander: Secondary | ICD-10-CM | POA: Diagnosis not present

## 2018-01-24 DIAGNOSIS — J301 Allergic rhinitis due to pollen: Secondary | ICD-10-CM | POA: Diagnosis not present

## 2018-01-27 ENCOUNTER — Ambulatory Visit: Payer: 59 | Admitting: Physician Assistant

## 2018-01-27 ENCOUNTER — Encounter: Payer: Self-pay | Admitting: Physician Assistant

## 2018-01-27 VITALS — BP 153/94 | HR 100 | Temp 98.2°F | Resp 16 | Ht 69.0 in | Wt 213.0 lb

## 2018-01-27 DIAGNOSIS — I10 Essential (primary) hypertension: Secondary | ICD-10-CM | POA: Diagnosis not present

## 2018-01-27 DIAGNOSIS — J0140 Acute pansinusitis, unspecified: Secondary | ICD-10-CM | POA: Diagnosis not present

## 2018-01-27 MED ORDER — DOXYCYCLINE HYCLATE 100 MG PO CAPS: 100.0000 mg | ORAL_CAPSULE | Freq: Two times a day (BID) | ORAL | 0 refills | Status: AC

## 2018-01-27 MED ORDER — FLUTICASONE PROPIONATE 50 MCG/ACT NA SUSP: 2.0000 | Freq: Every day | NASAL | 12 refills | Status: DC

## 2018-01-27 MED ORDER — LOSARTAN POTASSIUM 50 MG PO TABS: 50.0000 mg | ORAL_TABLET | Freq: Every day | ORAL | 0 refills | Status: DC

## 2018-01-27 NOTE — Patient Instructions (Signed)
We are going to start losartan.  Please take both medications at this time for your blood pressure. Please return in 2 weeks for recheck of your blood pressure. I am giving you an antibiotic for your sinus infection.  Continue taking zyrtec.  Take medication as prescribed. Losartan tablets What is this medicine? LOSARTAN (loe SAR tan) is used to treat high blood pressure and to reduce the risk of stroke in certain patients. This drug also slows the progression of kidney disease in patients with diabetes. This medicine may be used for other purposes; ask your health care provider or pharmacist if you have questions. COMMON BRAND NAME(S): Cozaar What should I tell my health care provider before I take this medicine? They need to know if you have any of these conditions: -heart failure -kidney or liver disease -an unusual or allergic reaction to losartan, other medicines, foods, dyes, or preservatives -pregnant or trying to get pregnant -breast-feeding How should I use this medicine? Take this medicine by mouth with a glass of water. Follow the directions on the prescription label. This medicine can be taken with or without food. Take your doses at regular intervals. Do not take your medicine more often than directed. Talk to your pediatrician regarding the use of this medicine in children. Special care may be needed. Overdosage: If you think you have taken too much of this medicine contact a poison control center or emergency room at once. NOTE: This medicine is only for you. Do not share this medicine with others. What if I miss a dose? If you miss a dose, take it as soon as you can. If it is almost time for your next dose, take only that dose. Do not take double or extra doses. What may interact with this medicine? -blood pressure medicines -diuretics, especially triamterene, spironolactone, or amiloride -fluconazole -NSAIDs, medicines for pain and inflammation, like ibuprofen or  naproxen -potassium salts or potassium supplements -rifampin This list may not describe all possible interactions. Give your health care provider a list of all the medicines, herbs, non-prescription drugs, or dietary supplements you use. Also tell them if you smoke, drink alcohol, or use illegal drugs. Some items may interact with your medicine. What should I watch for while using this medicine? Visit your doctor or health care professional for regular checks on your progress. Check your blood pressure as directed. Ask your doctor or health care professional what your blood pressure should be and when you should contact him or her. Call your doctor or health care professional if you notice an irregular or fast heart beat. Women should inform their doctor if they wish to become pregnant or think they might be pregnant. There is a potential for serious side effects to an unborn child, particularly in the second or third trimester. Talk to your health care professional or pharmacist for more information. You may get drowsy or dizzy. Do not drive, use machinery, or do anything that needs mental alertness until you know how this drug affects you. Do not stand or sit up quickly, especially if you are an older patient. This reduces the risk of dizzy or fainting spells. Alcohol can make you more drowsy and dizzy. Avoid alcoholic drinks. Avoid salt substitutes unless you are told otherwise by your doctor or health care professional. Do not treat yourself for coughs, colds, or pain while you are taking this medicine without asking your doctor or health care professional for advice. Some ingredients may increase your blood pressure. What side effects  may I notice from receiving this medicine? Side effects that you should report to your doctor or health care professional as soon as possible: -confusion, dizziness, light headedness or fainting spells -decreased amount of urine passed -difficulty breathing or  swallowing, hoarseness, or tightening of the throat -fast or irregular heart beat, palpitations, or chest pain -skin rash, itching -swelling of your face, lips, tongue, hands, or feet Side effects that usually do not require medical attention (report to your doctor or health care professional if they continue or are bothersome): -cough -decreased sexual function or desire -headache -nasal congestion or stuffiness -nausea or stomach pain -sore or cramping muscles This list may not describe all possible side effects. Call your doctor for medical advice about side effects. You may report side effects to FDA at 1-800-FDA-1088. Where should I keep my medicine? Keep out of the reach of children. Store at room temperature between 15 and 30 degrees C (59 and 86 degrees F). Protect from light. Keep container tightly closed. Throw away any unused medicine after the expiration date. NOTE: This sheet is a summary. It may not cover all possible information. If you have questions about this medicine, talk to your doctor, pharmacist, or health care provider.  2018 Elsevier/Gold Standard (2008-02-10 16:42:18)

## 2018-01-27 NOTE — Progress Notes (Signed)
PRIMARY CARE AT [facility] [address], [place] [address] [phone]  Date:  01/27/2018   Name:  Jesse Horton   DOB:  [birth year]   MRN:  [id number]  PCP:  Garnetta Buddy, PA    History of Present Illness:  Jesse Horton is a 57 y.o. adult patient who presents to PCP with  Chief Complaint  Patient presents with  . Hypertension    RECHECK     Patient reports compliance with taking his blood pressure medication of the amlodipine.  He has noticed no cp, palpitations, sob, or leg swellings. He does reports some congestion, sinus pain, thickened mucus, for >10 days.  He has not changed his diet much.  Does not always watch his salt intake, but states that he is not adding it to his meals.   He does push ups only, every now and then but no cardio at this time.    Patient Active Problem List   Diagnosis Date Noted  . Chronic allergic rhinitis 01/29/2018  . Allergic rhinitis due to pollen 01/29/2018  . Allergic rhinitis due to animal (cat) (dog) hair and dander 01/29/2018  . Erectile dysfunction due to arterial insufficiency 01/09/2015  . Elevated blood pressure 01/09/2015  . HTN (hypertension) 07/29/2012    History reviewed. No pertinent past medical history.  History reviewed. No pertinent surgical history.  Social History   Tobacco Use  . Smoking status: Former Smoker    Last attempt to quit: 12/29/1998    Years since quitting: 19.1  . Smokeless tobacco: Never Used  Substance Use Topics  . Alcohol use: No  . Drug use: No    History reviewed. No pertinent family history.  No Known Allergies  Medication list has been reviewed and updated.  Current Outpatient Medications on File Prior to Visit  Medication Sig Dispense Refill  . albuterol (VENTOLIN HFA) 108 (90 Base) MCG/ACT inhaler Inhale 2 puffs into the lungs every 6 (six) hours as needed for wheezing or shortness of breath. 6.7 g 5  . amLODipine (NORVASC) 5 MG tablet Take 1 tablet (5 mg total) by  mouth daily. 90 tablet 1  . ranitidine (ZANTAC) 150 MG tablet Take 1 tablet (150 mg total) by mouth 2 (two) times daily. 60 tablet 0  . triamcinolone (NASACORT) 55 MCG/ACT AERO nasal inhaler Place 2 sprays daily into the nose. 1 Inhaler 12  . benzonatate (TESSALON) 100 MG capsule Take 1-2 capsules (100-200 mg total) by mouth 3 (three) times daily as needed. (Patient not taking: Reported on 01/27/2018) 60 capsule 0  . HYDROcodone-homatropine (HYCODAN) 5-1.5 MG/5ML syrup Take 5 mLs by mouth at bedtime as needed. (Patient not taking: Reported on 01/27/2018) 50 mL 0  . sildenafil (VIAGRA) 50 MG tablet Take 1 tablet (50 mg total) by mouth as needed for erectile dysfunction. Needs office visit (Patient not taking: Reported on 01/27/2018) 10 tablet 1   No current facility-administered medications on file prior to visit.     ROS ROS otherwise unremarkable unless listed above.  Physical Examination: BP (!) 153/94   Pulse 100   Temp 98.2 F (36.8 C) (Oral)   Resp 16   Ht 5\' 9"  (1.753 m)   Wt 213 lb (96.6 kg)   SpO2 99%   BMI 31.45 kg/m  Ideal Body Weight: Weight in (lb) to have BMI = 25: 168.9  Physical Exam  Constitutional: He is oriented to person, place, and time. He appears well-developed and well-nourished. No distress.  HENT:  Head: Atraumatic.  Right Ear: Tympanic membrane, external ear and ear canal normal.  Left Ear: Tympanic membrane, external ear and ear canal normal.  Nose: Mucosal edema and rhinorrhea present. Right sinus exhibits no maxillary sinus tenderness and no frontal sinus tenderness. Left sinus exhibits maxillary sinus tenderness. Left sinus exhibits no frontal sinus tenderness.  Mouth/Throat: No uvula swelling. No oropharyngeal exudate, posterior oropharyngeal edema or posterior oropharyngeal erythema.  Eyes: Conjunctivae, EOM and lids are normal. Pupils are equal, round, and reactive to light. Right eye exhibits normal extraocular motion. Left eye exhibits normal  extraocular motion.  Neck: Trachea normal and full passive range of motion without pain. No edema and no erythema present.  Cardiovascular: Normal rate and regular rhythm. Exam reveals no gallop.  No murmur heard. Pulmonary/Chest: Effort normal. No respiratory distress. He has no decreased breath sounds. He has no wheezes. He has no rhonchi.  Neurological: He is alert and oriented to person, place, and time.  Skin: Skin is warm and dry. He is not diaphoretic.  Psychiatric: He has a normal mood and affect. His behavior is normal.     Assessment and Plan: Jesse Horton is a 57 y.o. adult who is here today for cc of  Chief Complaint  Patient presents with  . Hypertension    RECHECK  we will start losartan.  continue the amlodipine.  He will rtc in 2 weeks for BP recheck.   Will treat for sinus infection. A advised to continue zyrtec.  Essential hypertension - Plan: losartan (COZAAR) 50 MG tablet  Subacute pansinusitis - Plan: doxycycline (VIBRAMYCIN) 100 MG capsule, fluticasone (FLONASE) 50 MCG/ACT nasal spray  Trena PlattStephanie English, PA-C [facility] 2/26/20199:40 AM

## 2018-01-29 ENCOUNTER — Encounter: Payer: Self-pay | Admitting: Physician Assistant

## 2018-01-29 DIAGNOSIS — J3089 Other allergic rhinitis: Secondary | ICD-10-CM | POA: Insufficient documentation

## 2018-01-29 DIAGNOSIS — J3081 Allergic rhinitis due to animal (cat) (dog) hair and dander: Secondary | ICD-10-CM | POA: Insufficient documentation

## 2018-01-29 DIAGNOSIS — J301 Allergic rhinitis due to pollen: Secondary | ICD-10-CM | POA: Insufficient documentation

## 2018-01-29 DIAGNOSIS — J309 Allergic rhinitis, unspecified: Secondary | ICD-10-CM | POA: Insufficient documentation

## 2018-02-04 DIAGNOSIS — J3089 Other allergic rhinitis: Secondary | ICD-10-CM | POA: Diagnosis not present

## 2018-02-04 DIAGNOSIS — J3081 Allergic rhinitis due to animal (cat) (dog) hair and dander: Secondary | ICD-10-CM | POA: Diagnosis not present

## 2018-02-04 DIAGNOSIS — J301 Allergic rhinitis due to pollen: Secondary | ICD-10-CM | POA: Diagnosis not present

## 2018-02-10 ENCOUNTER — Ambulatory Visit: Payer: 59 | Admitting: Physician Assistant

## 2018-02-10 ENCOUNTER — Other Ambulatory Visit: Payer: Self-pay

## 2018-02-10 ENCOUNTER — Encounter: Payer: Self-pay | Admitting: Physician Assistant

## 2018-02-10 VITALS — BP 165/92 | HR 87 | Temp 98.1°F | Resp 16 | Ht 69.0 in | Wt 213.0 lb

## 2018-02-10 DIAGNOSIS — Z6831 Body mass index (BMI) 31.0-31.9, adult: Secondary | ICD-10-CM | POA: Diagnosis not present

## 2018-02-10 DIAGNOSIS — J3081 Allergic rhinitis due to animal (cat) (dog) hair and dander: Secondary | ICD-10-CM | POA: Diagnosis not present

## 2018-02-10 DIAGNOSIS — J301 Allergic rhinitis due to pollen: Secondary | ICD-10-CM | POA: Diagnosis not present

## 2018-02-10 DIAGNOSIS — Z9189 Other specified personal risk factors, not elsewhere classified: Secondary | ICD-10-CM | POA: Diagnosis not present

## 2018-02-10 DIAGNOSIS — I10 Essential (primary) hypertension: Secondary | ICD-10-CM | POA: Diagnosis not present

## 2018-02-10 DIAGNOSIS — J3089 Other allergic rhinitis: Secondary | ICD-10-CM | POA: Diagnosis not present

## 2018-02-10 MED ORDER — LOSARTAN POTASSIUM 50 MG PO TABS: 100.0000 mg | ORAL_TABLET | Freq: Every day | ORAL | 0 refills | Status: DC

## 2018-02-10 NOTE — Progress Notes (Signed)
PRIMARY CARE AT [facility] [address], [place] [address] [phone]  Date:  02/10/2018   Name:  Jesse Horton   DOB:  05-May-1961   MRN:  [id number]  PCP:  Jesse Buddy, PA    History of Present Illness:  Jesse Horton is a 57 y.o. adult patient who presents to PCP with  Chief Complaint  Patient presents with  . Hypertension    2 wks follow up  . Allergies    pt states when he wakes up some mornings his head hurts, from allergies     Follow up of blood pressure.  This continues to be elevated.  He is taking the BP meds compliantly.   Patient has noted waking in morning with aching head, congestion.  He has changed pillows.  Not dusting.  He notes that he does snore, and will wake gasping for air.    Patient Active Problem List   Diagnosis Date Noted  . Chronic allergic rhinitis 01/29/2018  . Allergic rhinitis due to pollen 01/29/2018  . Allergic rhinitis due to animal (cat) (dog) hair and dander 01/29/2018  . Erectile dysfunction due to arterial insufficiency 01/09/2015  . Elevated blood pressure 01/09/2015  . HTN (hypertension) 07/29/2012    History reviewed. No pertinent past medical history.  History reviewed. No pertinent surgical history.  Social History   Tobacco Use  . Smoking status: Former Smoker    Last attempt to quit: 12/29/1998    Years since quitting: 19.1  . Smokeless tobacco: Never Used  Substance Use Topics  . Alcohol use: No  . Drug use: No    History reviewed. No pertinent family history.  No Known Allergies  Medication list has been reviewed and updated.  Current Outpatient Medications on File Prior to Visit  Medication Sig Dispense Refill  . albuterol (VENTOLIN HFA) 108 (90 Base) MCG/ACT inhaler Inhale 2 puffs into the lungs every 6 (six) hours as needed for wheezing or shortness of breath. 6.7 g 5  . amLODipine (NORVASC) 5 MG tablet Take 1 tablet (5 mg total) by mouth daily. 90 tablet 1  . fluticasone (FLONASE) 50  MCG/ACT nasal spray Place 2 sprays into both nostrils daily. 16 g 12  . ranitidine (ZANTAC) 150 MG tablet Take 1 tablet (150 mg total) by mouth 2 (two) times daily. 60 tablet 0  . sildenafil (VIAGRA) 50 MG tablet Take 1 tablet (50 mg total) by mouth as needed for erectile dysfunction. Needs office visit 10 tablet 1  . triamcinolone (NASACORT) 55 MCG/ACT AERO nasal inhaler Place 2 sprays daily into the nose. 1 Inhaler 12  . benzonatate (TESSALON) 100 MG capsule Take 1-2 capsules (100-200 mg total) by mouth 3 (three) times daily as needed. (Patient not taking: Reported on 01/27/2018) 60 capsule 0  . HYDROcodone-homatropine (HYCODAN) 5-1.5 MG/5ML syrup Take 5 mLs by mouth at bedtime as needed. (Patient not taking: Reported on 01/27/2018) 50 mL 0   No current facility-administered medications on file prior to visit.     ROS ROS otherwise unremarkable unless listed above.  Physical Examination: BP (!) 165/92   Pulse 87   Temp 98.1 F (36.7 C) (Oral)   Resp 16   Ht 5\' 9"  (1.753 m)   Wt 213 lb (96.6 kg)   SpO2 97%   BMI 31.45 kg/m  Ideal Body Weight: Weight in (lb) to have BMI = 25: 168.9  Physical Exam  Constitutional: He is oriented to person, place, and time. He appears well-developed and well-nourished.  No distress.  HENT:  Head: Normocephalic and atraumatic.  Right Ear: External ear normal.  Left Ear: External ear normal.  Mouth/Throat: No oropharyngeal exudate or posterior oropharyngeal erythema. Tonsils are 2+ on the right. Tonsils are 2+ on the left.  Eyes: Conjunctivae and EOM are normal. Pupils are equal, round, and reactive to light.  Cardiovascular: Normal rate.  Pulmonary/Chest: Effort normal. No respiratory distress.  Neurological: He is alert and oriented to person, place, and time.  Skin: He is not diaphoretic.  Psychiatric: He has a normal mood and affect. His behavior is normal.     Assessment and Plan: Jesse Horton is a 57 y.o. adult who is here today for cc  of  Chief Complaint  Patient presents with  . Hypertension    2 wks follow up  . Allergies    pt states when he wakes up some mornings his head hurts, from allergies  obtaining following labs.  advised sleep study, this may be contributory to his symptoms.  Essential hypertension - Plan: TSH, Basic metabolic panel, losartan (COZAAR) 50 MG tablet  At risk for sleep apnea - Plan: TSH, Basic metabolic panel, Ambulatory referral to Sleep Studies  BMI 31.0-31.9,adult - Plan: TSH, Basic metabolic panel, Ambulatory referral to Sleep Studies  Jesse PlattStephanie English, PA-C [facility] 3/12/20192:21 PM

## 2018-02-10 NOTE — Patient Instructions (Addendum)
  Please increase your losartan to 100mg  daily.  I will refer you to a sleep study.  This can drive your blood pressure up, and give you the fatigue you are having.  Please make sure you make this appointment.   Continue to take the amlodipine as well.  We will recheck you in 3 weeks.     IF you received an x-ray today, you will receive an invoice from [facility]. Please contact [facility] at [phone] with questions or concerns regarding your invoice.   IF you received labwork today, you will receive an invoice from [facility]. Please contact [facility] at [phone] with questions or concerns regarding your invoice.   Our billing staff will not be able to assist you with questions regarding bills from these companies.  You will be contacted with the lab results as soon as they are available. The fastest way to get your results is to activate your My Chart account. Instructions are located on the last page of this paperwork. If you have not heard from us regarding the results in 2 weeks, please contact this office.

## 2018-02-11 LAB — BASIC METABOLIC PANEL
BUN / CREAT RATIO: 13 (ref 9–20)
BUN: 13 mg/dL (ref 6–24)
CHLORIDE: 102 mmol/L (ref 96–106)
CO2: 22 mmol/L (ref 20–29)
Calcium: 9.3 mg/dL (ref 8.7–10.2)
Creatinine, Ser: 1.03 mg/dL (ref 0.76–1.27)
GFR calc Af Amer: 94 mL/min/{1.73_m2} (ref >59)
GFR calc non Af Amer: 81 mL/min/{1.73_m2} (ref >59)
GLUCOSE: 107 mg/dL — AB (ref 65–99)
POTASSIUM: 3.7 mmol/L (ref 3.5–5.2)
Sodium: 140 mmol/L (ref 134–144)

## 2018-02-11 LAB — TSH: TSH: 1.11 u[IU]/mL (ref 0.450–4.500)

## 2018-02-18 DIAGNOSIS — J3081 Allergic rhinitis due to animal (cat) (dog) hair and dander: Secondary | ICD-10-CM | POA: Diagnosis not present

## 2018-02-18 DIAGNOSIS — J301 Allergic rhinitis due to pollen: Secondary | ICD-10-CM | POA: Diagnosis not present

## 2018-02-18 DIAGNOSIS — J3089 Other allergic rhinitis: Secondary | ICD-10-CM | POA: Diagnosis not present

## 2018-02-25 DIAGNOSIS — J3081 Allergic rhinitis due to animal (cat) (dog) hair and dander: Secondary | ICD-10-CM | POA: Diagnosis not present

## 2018-02-25 DIAGNOSIS — J301 Allergic rhinitis due to pollen: Secondary | ICD-10-CM | POA: Diagnosis not present

## 2018-02-25 DIAGNOSIS — J3089 Other allergic rhinitis: Secondary | ICD-10-CM | POA: Diagnosis not present

## 2018-03-03 ENCOUNTER — Ambulatory Visit: Payer: 59 | Admitting: Physician Assistant

## 2018-03-04 DIAGNOSIS — J3089 Other allergic rhinitis: Secondary | ICD-10-CM | POA: Diagnosis not present

## 2018-03-04 DIAGNOSIS — J3081 Allergic rhinitis due to animal (cat) (dog) hair and dander: Secondary | ICD-10-CM | POA: Diagnosis not present

## 2018-03-04 DIAGNOSIS — J301 Allergic rhinitis due to pollen: Secondary | ICD-10-CM | POA: Diagnosis not present

## 2018-03-11 DIAGNOSIS — J3081 Allergic rhinitis due to animal (cat) (dog) hair and dander: Secondary | ICD-10-CM | POA: Diagnosis not present

## 2018-03-11 DIAGNOSIS — J3089 Other allergic rhinitis: Secondary | ICD-10-CM | POA: Diagnosis not present

## 2018-03-11 DIAGNOSIS — J301 Allergic rhinitis due to pollen: Secondary | ICD-10-CM | POA: Diagnosis not present

## 2018-03-17 ENCOUNTER — Encounter: Payer: Self-pay | Admitting: Neurology

## 2018-03-17 ENCOUNTER — Ambulatory Visit: Payer: 59 | Admitting: Neurology

## 2018-03-17 VITALS — BP 171/103 | HR 88 | Ht 67.0 in | Wt 215.0 lb

## 2018-03-17 DIAGNOSIS — G4730 Sleep apnea, unspecified: Secondary | ICD-10-CM | POA: Diagnosis not present

## 2018-03-17 DIAGNOSIS — G4710 Hypersomnia, unspecified: Secondary | ICD-10-CM

## 2018-03-17 DIAGNOSIS — J3081 Allergic rhinitis due to animal (cat) (dog) hair and dander: Secondary | ICD-10-CM | POA: Diagnosis not present

## 2018-03-17 DIAGNOSIS — I10 Essential (primary) hypertension: Secondary | ICD-10-CM

## 2018-03-17 DIAGNOSIS — K219 Gastro-esophageal reflux disease without esophagitis: Secondary | ICD-10-CM | POA: Diagnosis not present

## 2018-03-17 DIAGNOSIS — R0681 Apnea, not elsewhere classified: Secondary | ICD-10-CM

## 2018-03-17 DIAGNOSIS — R0683 Snoring: Secondary | ICD-10-CM

## 2018-03-17 DIAGNOSIS — J3089 Other allergic rhinitis: Secondary | ICD-10-CM | POA: Diagnosis not present

## 2018-03-17 DIAGNOSIS — J301 Allergic rhinitis due to pollen: Secondary | ICD-10-CM | POA: Diagnosis not present

## 2018-03-17 NOTE — Patient Instructions (Signed)
DASH Eating Plan DASH stands for "Dietary Approaches to Stop Hypertension." The DASH eating plan is a healthy eating plan that has been shown to reduce high blood pressure (hypertension). It may also reduce your risk for type 2 diabetes, heart disease, and stroke. The DASH eating plan may also help with weight loss. What are tips for following this plan? General guidelines  Avoid eating more than 2,300 mg (milligrams) of salt (sodium) a day. If you have hypertension, you may need to reduce your sodium intake to 1,500 mg a day.  Limit alcohol intake to no more than 1 drink a day for nonpregnant women and 2 drinks a day for men. One drink equals 12 oz of beer, 5 oz of wine, or 1 oz of hard liquor.  Work with your health care provider to maintain a healthy body weight or to lose weight. Ask what an ideal weight is for you.  Get at least 30 minutes of exercise that causes your heart to beat faster (aerobic exercise) most days of the week. Activities may include walking, swimming, or biking.  Work with your health care provider or diet and nutrition specialist (dietitian) to adjust your eating plan to your individual calorie needs. Reading food labels  Check food labels for the amount of sodium per serving. Choose foods with less than 5 percent of the Daily Value of sodium. Generally, foods with less than 300 mg of sodium per serving fit into this eating plan.  To find whole grains, look for the word "whole" as the first word in the ingredient list. Shopping  Buy products labeled as "low-sodium" or "no salt added."  Buy fresh foods. Avoid canned foods and premade or frozen meals. Cooking  Avoid adding salt when cooking. Use salt-free seasonings or herbs instead of table salt or sea salt. Check with your health care provider or pharmacist before using salt substitutes.  Do not fry foods. Cook foods using healthy methods such as baking, boiling, grilling, and broiling instead.  Cook with  heart-healthy oils, such as olive, canola, soybean, or sunflower oil. Meal planning   Eat a balanced diet that includes: ? 5 or more servings of fruits and vegetables each day. At each meal, try to fill half of your plate with fruits and vegetables. ? Up to 6-8 servings of whole grains each day. ? Less than 6 oz of lean meat, poultry, or fish each day. A 3-oz serving of meat is about the same size as a deck of cards. One egg equals 1 oz. ? 2 servings of low-fat dairy each day. ? A serving of nuts, seeds, or beans 5 times each week. ? Heart-healthy fats. Healthy fats called Omega-3 fatty acids are found in foods such as flaxseeds and coldwater fish, like sardines, salmon, and mackerel.  Limit how much you eat of the following: ? Canned or prepackaged foods. ? Food that is high in trans fat, such as fried foods. ? Food that is high in saturated fat, such as fatty meat. ? Sweets, desserts, sugary drinks, and other foods with added sugar. ? Full-fat dairy products.  Do not salt foods before eating.  Try to eat at least 2 vegetarian meals each week.  Eat more home-cooked food and less restaurant, buffet, and fast food.  When eating at a restaurant, ask that your food be prepared with less salt or no salt, if possible. What foods are recommended? The items listed may not be a complete list. Talk with your dietitian about what   dietary choices are best for you. Grains Whole-grain or whole-wheat bread. Whole-grain or whole-wheat pasta. Brown rice. Oatmeal. Quinoa. Bulgur. Whole-grain and low-sodium cereals. Pita bread. Low-fat, low-sodium crackers. Whole-wheat flour tortillas. Vegetables Fresh or frozen vegetables (raw, steamed, roasted, or grilled). Low-sodium or reduced-sodium tomato and vegetable juice. Low-sodium or reduced-sodium tomato sauce and tomato paste. Low-sodium or reduced-sodium canned vegetables. Fruits All fresh, dried, or frozen fruit. Canned fruit in natural juice (without  added sugar). Meat and other protein foods Skinless chicken or turkey. Ground chicken or turkey. Pork with fat trimmed off. Fish and seafood. Egg whites. Dried beans, peas, or lentils. Unsalted nuts, nut butters, and seeds. Unsalted canned beans. Lean cuts of beef with fat trimmed off. Low-sodium, lean deli meat. Dairy Low-fat (1%) or fat-free (skim) milk. Fat-free, low-fat, or reduced-fat cheeses. Nonfat, low-sodium ricotta or cottage cheese. Low-fat or nonfat yogurt. Low-fat, low-sodium cheese. Fats and oils Soft margarine without trans fats. Vegetable oil. Low-fat, reduced-fat, or light mayonnaise and salad dressings (reduced-sodium). Canola, safflower, olive, soybean, and sunflower oils. Avocado. Seasoning and other foods Herbs. Spices. Seasoning mixes without salt. Unsalted popcorn and pretzels. Fat-free sweets. What foods are not recommended? The items listed may not be a complete list. Talk with your dietitian about what dietary choices are best for you. Grains Baked goods made with fat, such as croissants, muffins, or some breads. Dry pasta or rice meal packs. Vegetables Creamed or fried vegetables. Vegetables in a cheese sauce. Regular canned vegetables (not low-sodium or reduced-sodium). Regular canned tomato sauce and paste (not low-sodium or reduced-sodium). Regular tomato and vegetable juice (not low-sodium or reduced-sodium). Pickles. Olives. Fruits Canned fruit in a light or heavy syrup. Fried fruit. Fruit in cream or butter sauce. Meat and other protein foods Fatty cuts of meat. Ribs. Fried meat. Bacon. Sausage. Bologna and other processed lunch meats. Salami. Fatback. Hotdogs. Bratwurst. Salted nuts and seeds. Canned beans with added salt. Canned or smoked fish. Whole eggs or egg yolks. Chicken or turkey with skin. Dairy Whole or 2% milk, cream, and half-and-half. Whole or full-fat cream cheese. Whole-fat or sweetened yogurt. Full-fat cheese. Nondairy creamers. Whipped toppings.  Processed cheese and cheese spreads. Fats and oils Butter. Stick margarine. Lard. Shortening. Ghee. Bacon fat. Tropical oils, such as coconut, palm kernel, or palm oil. Seasoning and other foods Salted popcorn and pretzels. Onion salt, garlic salt, seasoned salt, table salt, and sea salt. Worcestershire sauce. Tartar sauce. Barbecue sauce. Teriyaki sauce. Soy sauce, including reduced-sodium. Steak sauce. Canned and packaged gravies. Fish sauce. Oyster sauce. Cocktail sauce. Horseradish that you find on the shelf. Ketchup. Mustard. Meat flavorings and tenderizers. Bouillon cubes. Hot sauce and Tabasco sauce. Premade or packaged marinades. Premade or packaged taco seasonings. Relishes. Regular salad dressings. Where to find more information:  National Heart, Lung, and Blood Institute: www.nhlbi.nih.gov  American Heart Association: www.heart.org Summary  The DASH eating plan is a healthy eating plan that has been shown to reduce high blood pressure (hypertension). It may also reduce your risk for type 2 diabetes, heart disease, and stroke.  With the DASH eating plan, you should limit salt (sodium) intake to 2,300 mg a day. If you have hypertension, you may need to reduce your sodium intake to 1,500 mg a day.  When on the DASH eating plan, aim to eat more fresh fruits and vegetables, whole grains, lean proteins, low-fat dairy, and heart-healthy fats.  Work with your health care provider or diet and nutrition specialist (dietitian) to adjust your eating plan to your individual   calorie needs. This information is not intended to replace advice given to you by your health care provider. Make sure you discuss any questions you have with your health care provider. Document Released: 11/19/2011 Document Revised: 11/23/2016 Document Reviewed: 11/23/2016 Elsevier Interactive Patient Education  2018 Elsevier Inc.  

## 2018-03-17 NOTE — Progress Notes (Signed)
SLEEP MEDICINE CLINIC   Provider:  Melvyn Novas, M D  Primary Care Physician:  Garnetta Buddy, [place]   Referring Provider: Garnetta Buddy, PA  [facility]   Chief Complaint  Patient presents with  . New Patient (Initial Visit)    pt alone, rm 10. pt states that when he goes to sleep acid reflux is worse at night.Once he  falls asleep it's better but he wakes up frequently.Jesse KitchenHe has been told he snores and stops breathing in his sleep. He has daytime sleepiness.     HPI:  Jesse Horton is a 57 y.o. adult from [place], living in the [place] since 2000, seen here as a referral patient from [place]. English for a sleep evaluation.   Chief complaint according to patient : GERD and Gas interrupt my sleep, I am drowsy in daytime.   The patient is a 57 year old gentleman from [place] who has been followed for hypertension at [facility] primary care.  He has been taking blood pressure medications compliantly yet his blood pressure has not been controlled.  In addition he reports gastroesophageal reflux symptoms worse at night when he sleeps or is resting flat on a mattress.  He also has a prescription as needed for albuterol, he takes Flonase for allergic rhinitis, and Zantac for his stomach.  Nasacort nasal inhaler, Tessalon Perles as needed for cough, and he was briefly provided Hycodan cough syrup.  He is no longer taking this medication.  For blood pressure control he takes Norvasc 5 mg amlodipine, and he was placed on Cozaar 50 mg.  Losartan was increased from 5200 mg daily.  It was the nonresponsive blood pressure that led to this evaluation and referral for sleep study.  Sleep habits are as follows:  He goes to bed and has the lights and electronic switched off between 930 and 10 PM.  He sleeps in a cool, quiet and dark bedroom on a flat mattress he does not use a wedge or an adjustable bed.  He sleeps on one pillow only.  He has no trouble initiating sleep but sometimes is  woken up from sleep by acid reflux.  He has completed a gastrointestinal evaluation. He has been taking Zantac for 7 years without lasting success.  He does not have any nocturia, and some nights he will be uninterrupted and sleeps through the night until he has to rise in the morning.  He works early hours has to set his alarm for 5 AM and his work begins at 6 AM. 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   Sleep medical history and family sleep history:  HTN, BMI 31. GERD   Social history: from [place]. He is divorced , ex spouse is in [place]. Lives alone . Quit smoking 19 years ago.  ETOH- non drinker. Caffeine; green tea with honey  2-4 teas. Exercises at home.  He eats out a lot, and this affects his salt intake. He is not cooking for himself.    Review of Systems: Out of a complete 14 system review, the patient complains of only the following symptoms, and all other reviewed systems are negative.   snores , according to ex wife and other partners, Stop breathing.  Falls asleep easily when not active.  Has fallen asleep at  work.   Epworth score 9- 13 points   , Fatigue severity score 24 points   , depression score n/a    Social History   Socioeconomic History  . Marital status: Single    Spouse name: Not on file  . Number of children: Not on file  . Years of education: Not on file  . Highest education level: Not on file  Occupational History  . Not on file  Social Needs  . Financial resource strain: Not on file  . Food insecurity:    Worry: Not on file    Inability: Not on file  . Transportation needs:    Medical: Not on file    Non-medical: Not on file  Tobacco Use  . Smoking status: Former Smoker    Last attempt to quit: 12/29/1998    Years since quitting: 19.2  . Smokeless tobacco: Never Used    Substance and Sexual Activity  . Alcohol use: No  . Drug use: No  . Sexual activity: Never    Birth control/protection: Abstinence  Lifestyle  . Physical activity:    Days per week: Not on file    Minutes per session: Not on file  . Stress: Not on file  Relationships  . Social connections:    Talks on phone: Not on file    Gets together: Not on file    Attends religious service: Not on file    Active member of club or organization: Not on file    Attends meetings of clubs or organizations: Not on file    Relationship status: Not on file  . Intimate partner violence:    Fear of current or ex partner: Not on file    Emotionally abused: Not on file    Physically abused: Not on file    Forced sexual activity: Not on file  Other Topics Concern  . Not on file  Social History Narrative  . Not on file     Current Outpatient Medications  Medication Sig Dispense Refill  . albuterol (VENTOLIN HFA) 108 (90 Base) MCG/ACT inhaler Inhale 2 puffs into the lungs every 6 (six) hours as needed for wheezing or shortness of breath. 6.7 g 5  . amLODipine (NORVASC) 5 MG tablet Take 1 tablet (5 mg total) by mouth daily. 90 tablet 1  . benzonatate (TESSALON) 100 MG capsule Take 1-2 capsules (100-200 mg total) by mouth 3 (three) times daily as needed. 60 capsule 0  . fluticasone (FLONASE) 50 MCG/ACT nasal spray Place 2 sprays into both nostrils daily. 16 g 12  . HYDROcodone-homatropine (HYCODAN) 5-1.5 MG/5ML syrup Take 5 mLs by mouth at bedtime as needed. 50 mL 0  . losartan (COZAAR) 50 MG tablet Take 2 tablets (100 mg total) by mouth daily. 180 tablet 0  . ranitidine (ZANTAC) 150 MG tablet Take 1 tablet (150 mg total) by mouth 2 (two) times daily. 60 tablet 0  . sildenafil (VIAGRA) 50 MG tablet Take 1 tablet (50 mg total) by mouth as needed for erectile dysfunction. Needs office visit 10 tablet 1  . triamcinolone (NASACORT) 55 MCG/ACT AERO nasal inhaler Place 2 sprays daily into the nose. 1 Inhaler  12   No current facility-administered medications for this visit.     Allergies as of 03/17/2018  . (No Known Allergies)    Vitals: BP (!) 171/103   Pulse 88   Ht 5\' 7"  (1.702 m)   Wt 215 lb (97.5 kg)   BMI 33.67 kg/m  Last Weight:  Wt Readings from Last 1 Encounters:  03/17/18 215 lb (97.5 kg)   ZOX:WRUE mass index is 33.67 kg/m.     Last Height:   Ht Readings from Last 1 Encounters:  03/17/18 5\' 7"  (1.702 m)    Physical exam:  General: The patient is awake, alert and appears not in acute distress. The patient is well groomed. Head: Normocephalic, atraumatic. Neck is supple. Mallampati 3  neck circumference: 18. 5 ". Nasal airflow congested. Retrognathia is seen.  Cardiovascular:  Regular rate and rhythm , without  murmurs or carotid bruit, and without distended neck veins. Respiratory: Lungs are clear to auscultation. Skin:  Without evidence of edema, or rash Trunk: BMI is 33. The patient's posture is erect  Neurologic exam : The patient is awake and alert, oriented to place and time.   Attention span & concentration ability appears normal.  Speech is fluent,  without dysarthria, dysphonia or aphasia.  Mood and affect are appropriate.  Cranial nerves: Pupils are equal and briskly reactive to light. Funduscopic exam without evidence of pallor or edema.  Extraocular movements  in vertical and horizontal planes intact and without nystagmus. Visual fields by finger perimetry are intact. Hearing to finger rub intact.  Facial sensation intact to fine touch. Facial motor strength is symmetric and tongue and uvula move midline. Shoulder shrug was symmetrical.  Motor exam:  Normal tone, muscle bulk and symmetric strength in all extremities. Sensory:  Fine touch, pinprick and vibration were tested in all extremities. Proprioception tested in the upper extremities was normal. Coordination: Rapid alternating movements / Finger-to-nose maneuver  normal without evidence of ataxia,  dysmetria or tremor. Gait and station: Patient walks without assistive device . Deep tendon reflexes: in the  upper and lower extremities are symmetric and intact.    Assessment:  After physical and neurologic examination, review of laboratory studies,  Personal review of imaging studies, reports of other /same  Imaging studies, results of polysomnography and / or neurophysiology testing and pre-existing records as far as provided in visit., my assessment is   1) in spite of being compliant with his medication for hypertension the patient presented again with a systolic blood pressure of 170 and diastolic blood pressure of 100 mmHg, taken in both arms.  He may have to reduce his salt intake to get a better control, he may also need to be evaluated for renal artery stenosis. 2)He reports a dull throbbing headache which is likely a manifestation of hypertension as well.  3) EDS, snoring, OSA ?   In daytime he feels sometimes too drowsy but it is difficult for him not to fall asleep but he can control his sleepiness usually.  He describes a interrupted sleep at night not the difficulties with initiating but difficulties with maintaining sleep.    I think he has a high risk factors for sleep apnea and untreated sleep apnea can also manifest in nocturia, hypertension, and hyperglycemia.      I will order a home sleep test for the patient and we will meet after the test has been interpreted to see if he needs CPAP, dental device, or some other treatment options.  He does have some red prognathia and a rather large neck but he is also of muscular build.  He has been told that he snores and that he sometimes stops breathing.   The patient was advised of the nature of the diagnosed disorder , the treatment options and the  risks for general health and wellness  arising from not treating the condition.   I spent more than 40 minutes of face to face time with the patient.  Greater than 50% of time was  spent in counseling and coordination of care. We have discussed the diagnosis and differential and I answered the patient's questions.     Melvyn NovasARMEN Manali Mcelmurry, MD 03/17/2018, 12:01 PM  Certified in Neurology by ABPN Certified in Sleep Medicine by [facility]  [facility] [address], [address] [place], [place] [id number]

## 2018-03-23 ENCOUNTER — Encounter: Payer: Self-pay | Admitting: Physician Assistant

## 2018-03-25 DIAGNOSIS — J301 Allergic rhinitis due to pollen: Secondary | ICD-10-CM | POA: Diagnosis not present

## 2018-03-25 DIAGNOSIS — J3081 Allergic rhinitis due to animal (cat) (dog) hair and dander: Secondary | ICD-10-CM | POA: Diagnosis not present

## 2018-03-25 DIAGNOSIS — J3089 Other allergic rhinitis: Secondary | ICD-10-CM | POA: Diagnosis not present

## 2018-03-27 ENCOUNTER — Encounter: Payer: Self-pay | Admitting: Physician Assistant

## 2018-04-07 DIAGNOSIS — J301 Allergic rhinitis due to pollen: Secondary | ICD-10-CM | POA: Diagnosis not present

## 2018-04-07 DIAGNOSIS — J3089 Other allergic rhinitis: Secondary | ICD-10-CM | POA: Diagnosis not present

## 2018-04-07 DIAGNOSIS — J3081 Allergic rhinitis due to animal (cat) (dog) hair and dander: Secondary | ICD-10-CM | POA: Diagnosis not present

## 2018-04-08 ENCOUNTER — Ambulatory Visit: Payer: 59 | Admitting: Urgent Care

## 2018-04-14 DIAGNOSIS — J3089 Other allergic rhinitis: Secondary | ICD-10-CM | POA: Diagnosis not present

## 2018-04-14 DIAGNOSIS — J301 Allergic rhinitis due to pollen: Secondary | ICD-10-CM | POA: Diagnosis not present

## 2018-04-14 DIAGNOSIS — J3081 Allergic rhinitis due to animal (cat) (dog) hair and dander: Secondary | ICD-10-CM | POA: Diagnosis not present

## 2018-04-15 ENCOUNTER — Ambulatory Visit: Payer: 59 | Admitting: Urgent Care

## 2018-04-20 ENCOUNTER — Ambulatory Visit (INDEPENDENT_AMBULATORY_CARE_PROVIDER_SITE_OTHER): Payer: 59 | Admitting: Neurology

## 2018-04-20 DIAGNOSIS — R0681 Apnea, not elsewhere classified: Secondary | ICD-10-CM

## 2018-04-20 DIAGNOSIS — J3089 Other allergic rhinitis: Secondary | ICD-10-CM | POA: Diagnosis not present

## 2018-04-20 DIAGNOSIS — J3081 Allergic rhinitis due to animal (cat) (dog) hair and dander: Secondary | ICD-10-CM | POA: Diagnosis not present

## 2018-04-20 DIAGNOSIS — R0683 Snoring: Secondary | ICD-10-CM

## 2018-04-20 DIAGNOSIS — J301 Allergic rhinitis due to pollen: Secondary | ICD-10-CM | POA: Diagnosis not present

## 2018-04-20 DIAGNOSIS — I10 Essential (primary) hypertension: Secondary | ICD-10-CM

## 2018-04-20 DIAGNOSIS — G4710 Hypersomnia, unspecified: Secondary | ICD-10-CM | POA: Diagnosis not present

## 2018-04-20 DIAGNOSIS — G4730 Sleep apnea, unspecified: Secondary | ICD-10-CM

## 2018-04-20 DIAGNOSIS — K219 Gastro-esophageal reflux disease without esophagitis: Secondary | ICD-10-CM

## 2018-04-28 DIAGNOSIS — J3089 Other allergic rhinitis: Secondary | ICD-10-CM | POA: Diagnosis not present

## 2018-04-28 DIAGNOSIS — J3081 Allergic rhinitis due to animal (cat) (dog) hair and dander: Secondary | ICD-10-CM | POA: Diagnosis not present

## 2018-04-28 DIAGNOSIS — J301 Allergic rhinitis due to pollen: Secondary | ICD-10-CM | POA: Diagnosis not present

## 2018-04-29 ENCOUNTER — Telehealth: Payer: Self-pay | Admitting: Neurology

## 2018-04-29 NOTE — Telephone Encounter (Signed)
Will read right now.

## 2018-04-29 NOTE — Procedures (Signed)
NAME:  Jesse Horton                                                                DOB: 08-04-1961 MEDICAL RECORD [id number]                                                          DOS:  05/80/2019 REFERRING PHYSICIAN: [name], P.A. STUDY PERFORMED: Home Sleep Study on Apnea Link  HISTORY: The patient is a 57 year old gentleman from [place] who has been followed for hypertension at [facility].   He has been taking several blood pressure medications compliantly yet his blood pressure has not been controlled.  In addition he reports gastroesophageal reflux symptoms, allergic rhinitis, obesity, and has retrognathia.   It was the medication nonresponsive blood pressure that led to this evaluation and referral for sleep study. Epworth Sleepiness Score -9. BMI: 33.6  STUDY RESULTS:  Total Recording Time:  6 hours; 24 minutes. Valid test time 5 h and 30 min.  Total Apnea/Hypopnea Index (AHI): 13.6 /h; RDI:  16.9 /h Average Oxygen Saturation: 93%; Lowest Oxygen Saturation: 86 %  Total Time Oxygen Saturation below 89 %: 0.5 minutes  Average Heart Rate: 93 bpm (between 71 and 118 bpm) IMPRESSION:  Mild OSA (obstructive Sleep Apnea) at AHI 13.6/ higher RDI indicated moderate Snoring. No sign of physiological stressors by heart rate, no SpO2 desaturation of significance.  RECOMMENDATION: This Apnea can be treated by CPAP, Dental device, or by weight loss.  If the patient is willing to try CPAP, I would provide auto Titration CPAP machine between 5-12 cm water pressure, non FFM and heated humidity. *We can also refer to a sleep specialized dentist. Some health insurance will cover costs only if CPAP failed.  I certify that I have reviewed the raw data recording prior to the issuance of this report in accordance with the standards of the American Academy of Sleep Medicine (AASM). Melvyn Novas, M.D.    04-29-2018     Medical Director of [facility], accredited by the  AASM. Diplomat of the ABPN and ABSM.

## 2018-04-29 NOTE — Addendum Note (Signed)
Addended by: Melvyn Novas on: 04/29/2018 02:31 PM   Modules accepted: Orders

## 2018-04-29 NOTE — Telephone Encounter (Signed)
Pt request sleep study results. He is aware it can take up to 14 days for results

## 2018-05-02 ENCOUNTER — Telehealth: Payer: Self-pay | Admitting: Neurology

## 2018-05-02 ENCOUNTER — Other Ambulatory Visit: Payer: Self-pay | Admitting: Urgent Care

## 2018-05-02 DIAGNOSIS — G4730 Sleep apnea, unspecified: Secondary | ICD-10-CM

## 2018-05-02 DIAGNOSIS — G4710 Hypersomnia, unspecified: Secondary | ICD-10-CM

## 2018-05-02 DIAGNOSIS — R0683 Snoring: Secondary | ICD-10-CM

## 2018-05-02 NOTE — Telephone Encounter (Signed)
Pt returned call and I was able to go over the sleep study results with him. I went over the recommendations by Dr Vickey Huger and informed him of what each one was and how it helped treating. Pt would like to proceed with dental device at this time and see if its covered with insurance. He verbalized if it was not he would contact us back and have [place] send the orders for cpap at that time. I will place the referral for the pt to dentist for the dental device.

## 2018-05-02 NOTE — Telephone Encounter (Signed)
-----   Message from Melvyn Novas, MD sent at 04/29/2018  2:31 PM EDT ----- RECOMMENDATION: This Apnea can be treated by CPAP, Dental device,  or by weight loss.  If the patient is willing to try CPAP, I would provide auto  Titration CPAP machine between 5-12 cm water pressure, non FFM  and heated humidity. CPAP is preferred as it is the treatment most likely to affect HTN.

## 2018-05-02 NOTE — Telephone Encounter (Signed)
Called patient to discuss sleep study results. No answer at this time. LVM for the patient to call back.   

## 2018-05-02 NOTE — Addendum Note (Signed)
Addended by: Judi Cong on: 05/02/2018 11:40 AM   Modules accepted: Orders

## 2018-05-04 DIAGNOSIS — J3089 Other allergic rhinitis: Secondary | ICD-10-CM | POA: Diagnosis not present

## 2018-05-04 DIAGNOSIS — J3081 Allergic rhinitis due to animal (cat) (dog) hair and dander: Secondary | ICD-10-CM | POA: Diagnosis not present

## 2018-05-04 DIAGNOSIS — J301 Allergic rhinitis due to pollen: Secondary | ICD-10-CM | POA: Diagnosis not present

## 2018-05-04 NOTE — Telephone Encounter (Signed)
mychart message sent to pt about making an apt °

## 2018-05-13 ENCOUNTER — Encounter: Payer: Self-pay | Admitting: Urgent Care

## 2018-05-13 ENCOUNTER — Other Ambulatory Visit: Payer: Self-pay

## 2018-05-13 ENCOUNTER — Ambulatory Visit: Payer: 59 | Admitting: Urgent Care

## 2018-05-13 VITALS — BP 164/94 | HR 78 | Temp 98.4°F | Resp 18 | Ht 67.0 in | Wt 199.6 lb

## 2018-05-13 DIAGNOSIS — Z6831 Body mass index (BMI) 31.0-31.9, adult: Secondary | ICD-10-CM | POA: Diagnosis not present

## 2018-05-13 DIAGNOSIS — J301 Allergic rhinitis due to pollen: Secondary | ICD-10-CM | POA: Diagnosis not present

## 2018-05-13 DIAGNOSIS — E669 Obesity, unspecified: Secondary | ICD-10-CM | POA: Diagnosis not present

## 2018-05-13 DIAGNOSIS — I10 Essential (primary) hypertension: Secondary | ICD-10-CM | POA: Diagnosis not present

## 2018-05-13 DIAGNOSIS — R030 Elevated blood-pressure reading, without diagnosis of hypertension: Secondary | ICD-10-CM | POA: Diagnosis not present

## 2018-05-13 DIAGNOSIS — J3089 Other allergic rhinitis: Secondary | ICD-10-CM | POA: Diagnosis not present

## 2018-05-13 DIAGNOSIS — J3081 Allergic rhinitis due to animal (cat) (dog) hair and dander: Secondary | ICD-10-CM | POA: Diagnosis not present

## 2018-05-13 MED ORDER — AMLODIPINE BESYLATE 10 MG PO TABS: 10.0000 mg | ORAL_TABLET | Freq: Every day | ORAL | 0 refills | Status: DC

## 2018-05-13 MED ORDER — LISINOPRIL-HYDROCHLOROTHIAZIDE 10-12.5 MG PO TABS: 1.0000 | ORAL_TABLET | Freq: Every day | ORAL | 0 refills | Status: DC

## 2018-05-13 NOTE — Patient Instructions (Addendum)
Avoid all salt in your diet including seasoning when you cook your food. Pay attention to the sodium labels to make sure it is very low. Do not eat at restaurants anymore.   Salads - kale, spinach, cabbage, spring mix; use seeds like pumpkin seeds or sunflower seeds; you can also use 1-2 hard boiled eggs. Fruits - avocadoes, berries (blueberries, raspberries, blackberries), apples, oranges, pomegranate, grapefruit Seeds - quinoa, chia seeds; you can also incorporate oatmeal Vegetables - aspargus, cauliflower, broccoli, green beans, brussel spouts, bell peppers; stay away from starchy vegetables like potatoes, carrots, peas   Hypertension Hypertension, commonly called high blood pressure, is when the force of blood pumping through the arteries is too strong. The arteries are the blood vessels that carry blood from the heart throughout the body. Hypertension forces the heart to work harder to pump blood and may cause arteries to become narrow or stiff. Having untreated or uncontrolled hypertension can cause heart attacks, strokes, kidney disease, and other problems. A blood pressure reading consists of a higher number over a lower number. Ideally, your blood pressure should be below 120/80. The first ("top") number is called the systolic pressure. It is a measure of the pressure in your arteries as your heart beats. The second ("bottom") number is called the diastolic pressure. It is a measure of the pressure in your arteries as the heart relaxes. What are the causes? The cause of this condition is not known. What increases the risk? Some risk factors for high blood pressure are under your control. Others are not. Factors you can change  Smoking.  Having type 2 diabetes mellitus, high cholesterol, or both.  Not getting enough exercise or physical activity.  Being overweight.  Having too much fat, sugar, calories, or salt (sodium) in your diet.  Drinking too much alcohol. Factors that are  difficult or impossible to change  Having chronic kidney disease.  Having a family history of high blood pressure.  Age. Risk increases with age.  Race. You may be at higher risk if you are African-American.  Gender. Men are at higher risk than women before age 51. After age 52, women are at higher risk than men.  Having obstructive sleep apnea.  Stress. What are the signs or symptoms? Extremely high blood pressure (hypertensive crisis) may cause:  Headache.  Anxiety.  Shortness of breath.  Nosebleed.  Nausea and vomiting.  Severe chest pain.  Jerky movements you cannot control (seizures).  How is this diagnosed? This condition is diagnosed by measuring your blood pressure while you are seated, with your arm resting on a surface. The cuff of the blood pressure monitor will be placed directly against the skin of your upper arm at the level of your heart. It should be measured at least twice using the same arm. Certain conditions can cause a difference in blood pressure between your right and left arms. Certain factors can cause blood pressure readings to be lower or higher than normal (elevated) for a short period of time:  When your blood pressure is higher when you are in a health care provider's office than when you are at home, this is called white coat hypertension. Most people with this condition do not need medicines.  When your blood pressure is higher at home than when you are in a health care provider's office, this is called masked hypertension. Most people with this condition may need medicines to control blood pressure.  If you have a high blood pressure reading during one  visit or you have normal blood pressure with other risk factors:  You may be asked to return on a different day to have your blood pressure checked again.  You may be asked to monitor your blood pressure at home for 1 week or longer.  If you are diagnosed with hypertension, you may have  other blood or imaging tests to help your health care provider understand your overall risk for other conditions. How is this treated? This condition is treated by making healthy lifestyle changes, such as eating healthy foods, exercising more, and reducing your alcohol intake. Your health care provider may prescribe medicine if lifestyle changes are not enough to get your blood pressure under control, and if:  Your systolic blood pressure is above 130.  Your diastolic blood pressure is above 80.  Your personal target blood pressure may vary depending on your medical conditions, your age, and other factors. Follow these instructions at home: Eating and drinking  Eat a diet that is high in fiber and potassium, and low in sodium, added sugar, and fat. An example eating plan is called the DASH (Dietary Approaches to Stop Hypertension) diet. To eat this way: ? Eat plenty of fresh fruits and vegetables. Try to fill half of your plate at each meal with fruits and vegetables. ? Eat whole grains, such as whole wheat pasta, brown rice, or whole grain bread. Fill about one quarter of your plate with whole grains. ? Eat or drink low-fat dairy products, such as skim milk or low-fat yogurt. ? Avoid fatty cuts of meat, processed or cured meats, and poultry with skin. Fill about one quarter of your plate with lean proteins, such as fish, chicken without skin, beans, eggs, and tofu. ? Avoid premade and processed foods. These tend to be higher in sodium, added sugar, and fat.  Reduce your daily sodium intake. Most people with hypertension should eat less than 1,500 mg of sodium a day.  Limit alcohol intake to no more than 1 drink a day for nonpregnant women and 2 drinks a day for men. One drink equals 12 oz of beer, 5 oz of wine, or 1 oz of hard liquor. Lifestyle  Work with your health care provider to maintain a healthy body weight or to lose weight. Ask what an ideal weight is for you.  Get at least 30  minutes of exercise that causes your heart to beat faster (aerobic exercise) most days of the week. Activities may include walking, swimming, or biking.  Include exercise to strengthen your muscles (resistance exercise), such as pilates or lifting weights, as part of your weekly exercise routine. Try to do these types of exercises for 30 minutes at least 3 days a week.  Do not use any products that contain nicotine or tobacco, such as cigarettes and e-cigarettes. If you need help quitting, ask your health care provider.  Monitor your blood pressure at home as told by your health care provider.  Keep all follow-up visits as told by your health care provider. This is important. Medicines  Take over-the-counter and prescription medicines only as told by your health care provider. Follow directions carefully. Blood pressure medicines must be taken as prescribed.  Do not skip doses of blood pressure medicine. Doing this puts you at risk for problems and can make the medicine less effective.  Ask your health care provider about side effects or reactions to medicines that you should watch for. Contact a health care provider if:  You think you are  having a reaction to a medicine you are taking.  You have headaches that keep coming back (recurring).  You feel dizzy.  You have swelling in your ankles.  You have trouble with your vision. Get help right away if:  You develop a severe headache or confusion.  You have unusual weakness or numbness.  You feel faint.  You have severe pain in your chest or abdomen.  You vomit repeatedly.  You have trouble breathing. Summary  Hypertension is when the force of blood pumping through your arteries is too strong. If this condition is not controlled, it may put you at risk for serious complications.  Your personal target blood pressure may vary depending on your medical conditions, your age, and other factors. For most people, a normal blood  pressure is less than 120/80.  Hypertension is treated with lifestyle changes, medicines, or a combination of both. Lifestyle changes include weight loss, eating a healthy, low-sodium diet, exercising more, and limiting alcohol. This information is not intended to replace advice given to you by your health care provider. Make sure you discuss any questions you have with your health care provider. Document Released: 11/30/2005 Document Revised: 10/28/2016 Document Reviewed: 10/28/2016 Elsevier Interactive Patient Education  2018 [employer].     IF you received an x-ray today, you will receive an invoice from [facility]. Please contact [facility] at [phone] with questions or concerns regarding your invoice.   IF you received labwork today, you will receive an invoice from [facility]. Please contact [facility] at [phone] with questions or concerns regarding your invoice.   Our billing staff will not be able to assist you with questions regarding bills from these companies.  You will be contacted with the lab results as soon as they are available. The fastest way to get your results is to activate your My Chart account. Instructions are located on the last page of this paperwork. If you have not heard from [place] regarding the results in 2 weeks, please contact this office.

## 2018-05-13 NOTE — Progress Notes (Signed)
    MRN: [id number] DOB: 1961-10-27  Subjective:   Jesse Horton is a 57 y.o. adult presenting for follow up on Hypertension. Currently managed with Dilaudid pain 5 mg twice daily, losartan 50 mg twice daily.  Admits that his diet is noncompliant but states that he does not know his blood pressure was related to his diet.  Denies dizziness, chronic headache, blurred vision, chest pain, shortness of breath, heart racing, palpitations, nausea, vomiting, abdominal pain, hematuria, lower leg swelling. Denies smoking cigarettes or drinking alcohol.   Jesse Horton has a current medication list which includes the following prescription(s): amlodipine, losartan, ranitidine, and sildenafil. Also has No Known Allergies.  Jesse Horton  has no past medical history on file. Also  has no past surgical history on file.  Objective:   Vitals: BP (!) 164/94   Pulse 78   Temp 98.4 F (36.9 C) (Oral)   Resp 18   Ht  (1.702 m)   Wt 199 lb 9.6 oz (90.5 kg)   SpO2 99%   BMI 31.26 kg/m   BP Readings from Last 3 Encounters:  05/13/18 (!) 164/94  03/17/18 (!) 171/103  02/10/18 (!) 165/92    Physical Exam  Constitutional: He is oriented to person, place, and time. He appears well-developed and well-nourished.  HENT:  Mouth/Throat: Oropharynx is clear and moist.  Eyes: Pupils are equal, round, and reactive to light. EOM are normal. No scleral icterus.  Cardiovascular: Normal rate, regular rhythm and intact distal pulses. Exam reveals no gallop and no friction rub.  No murmur heard. Pulmonary/Chest: No respiratory distress. He has no wheezes. He has no rales.  Abdominal: Soft. Bowel sounds are normal. He exhibits no distension and no mass. There is no tenderness. There is no rebound and no guarding.  Musculoskeletal: He exhibits no edema.  Neurological: He is alert and oriented to person, place, and time.  Skin: Skin is warm.  Psychiatric: He has a normal mood and affect.    Assessment and Plan :    Essential hypertension - Plan: Lipid panel, Comprehensive metabolic panel, Microalbumin / creatinine urine ratio, amLODipine (NORVASC) 10 MG tablet  Elevated blood pressure reading  Class 1 obesity without serious comorbidity with body mass index (BMI) of 31.0 to 31.9 in adult, unspecified obesity type  He should states that he does not want to do preventative medicine today.  Would like to come back for an annual physical exam.  He is agreeable to dietary modifications.  I counseled that we also need to make adjustments to his medications including adding hydrochlorothiazide.  We will switch to lisinopril from losartan and maintain his amlodipine.  Patient will come back in 4 weeks.  Labs pending.  Jesse Bamberg, PA-C Primary Care at [facility] [phone] 05/13/2018  9:44 AM

## 2018-05-14 LAB — COMPREHENSIVE METABOLIC PANEL
A/G RATIO: 1.2 (ref 1.2–2.2)
ALK PHOS: 78 IU/L (ref 39–117)
ALT: 22 IU/L (ref 0–44)
AST: 21 IU/L (ref 0–40)
Albumin: 4.2 g/dL (ref 3.5–5.5)
BILIRUBIN TOTAL: 0.2 mg/dL (ref 0.0–1.2)
BUN/Creatinine Ratio: 15 (ref 9–20)
BUN: 17 mg/dL (ref 6–24)
CHLORIDE: 101 mmol/L (ref 96–106)
CO2: 19 mmol/L — ABNORMAL LOW (ref 20–29)
Calcium: 9.2 mg/dL (ref 8.7–10.2)
Creatinine, Ser: 1.16 mg/dL (ref 0.76–1.27)
GFR calc non Af Amer: 70 mL/min/{1.73_m2} (ref >59)
GFR, EST AFRICAN AMERICAN: 81 mL/min/{1.73_m2} (ref >59)
GLUCOSE: 108 mg/dL — AB (ref 65–99)
Globulin, Total: 3.4 g/dL (ref 1.5–4.5)
Potassium: 3.8 mmol/L (ref 3.5–5.2)
Sodium: 137 mmol/L (ref 134–144)
TOTAL PROTEIN: 7.6 g/dL (ref 6.0–8.5)

## 2018-05-14 LAB — LIPID PANEL
CHOL/HDL RATIO: 5.6 ratio — AB (ref 0.0–5.0)
Cholesterol, Total: 175 mg/dL (ref 100–199)
HDL: 31 mg/dL — AB (ref >39)
LDL Calculated: 106 mg/dL — ABNORMAL HIGH (ref 0–99)
Triglycerides: 191 mg/dL — ABNORMAL HIGH (ref 0–149)
VLDL CHOLESTEROL CAL: 38 mg/dL (ref 5–40)

## 2018-05-14 LAB — MICROALBUMIN / CREATININE URINE RATIO
CREATININE, UR: 165.3 mg/dL
MICROALB/CREAT RATIO: 10.6 mg/g{creat} (ref 0.0–30.0)
MICROALBUM., U, RANDOM: 17.6 ug/mL

## 2018-05-16 ENCOUNTER — Other Ambulatory Visit: Payer: Self-pay | Admitting: Urgent Care

## 2018-05-16 MED ORDER — ATORVASTATIN CALCIUM 20 MG PO TABS: 20.0000 mg | ORAL_TABLET | Freq: Every day | ORAL | 3 refills | Status: DC

## 2018-05-17 DIAGNOSIS — J301 Allergic rhinitis due to pollen: Secondary | ICD-10-CM | POA: Diagnosis not present

## 2018-05-17 DIAGNOSIS — J3081 Allergic rhinitis due to animal (cat) (dog) hair and dander: Secondary | ICD-10-CM | POA: Diagnosis not present

## 2018-05-17 DIAGNOSIS — J3089 Other allergic rhinitis: Secondary | ICD-10-CM | POA: Diagnosis not present

## 2018-05-26 NOTE — Telephone Encounter (Signed)
Called patient, since the dental device was not covered he would like to treat the apne with CPAP. I reviewed PAP compliance expectations with the pt. Pt is agreeable to starting a CPAP. I advised pt that an order will be sent to a DME, [facility], and [facility] will call the pt within about one week after they file with the pt's insurance. [facility] will show the pt how to use the machine, fit for masks, and troubleshoot the CPAP if needed. A follow up appt was made for insurance purposes with Dr. Vickey Hugerohmeier on Sept 18, 2019 at 11:00 am. Pt verbalized understanding to arrive 15 minutes early and bring their CPAP. A letter with all of this information in it will be mailed to the pt as a reminder. I verified with the pt that the address we have on file is correct. Pt verbalized understanding of results. Pt had no questions at this time but was encouraged to call back if questions arise.

## 2018-05-26 NOTE — Telephone Encounter (Signed)
Pt called to advise the insurance will not cover the dental device. He is wanting to proceed with CPAP. Please call to advise at [phone]

## 2018-05-27 DIAGNOSIS — J3089 Other allergic rhinitis: Secondary | ICD-10-CM | POA: Diagnosis not present

## 2018-05-27 DIAGNOSIS — J3081 Allergic rhinitis due to animal (cat) (dog) hair and dander: Secondary | ICD-10-CM | POA: Diagnosis not present

## 2018-05-27 DIAGNOSIS — J301 Allergic rhinitis due to pollen: Secondary | ICD-10-CM | POA: Diagnosis not present

## 2018-05-30 DIAGNOSIS — J3081 Allergic rhinitis due to animal (cat) (dog) hair and dander: Secondary | ICD-10-CM | POA: Diagnosis not present

## 2018-05-30 DIAGNOSIS — J3089 Other allergic rhinitis: Secondary | ICD-10-CM | POA: Diagnosis not present

## 2018-05-30 DIAGNOSIS — J301 Allergic rhinitis due to pollen: Secondary | ICD-10-CM | POA: Diagnosis not present

## 2018-06-09 DIAGNOSIS — J3089 Other allergic rhinitis: Secondary | ICD-10-CM | POA: Diagnosis not present

## 2018-06-09 DIAGNOSIS — J3081 Allergic rhinitis due to animal (cat) (dog) hair and dander: Secondary | ICD-10-CM | POA: Diagnosis not present

## 2018-06-09 DIAGNOSIS — J301 Allergic rhinitis due to pollen: Secondary | ICD-10-CM | POA: Diagnosis not present

## 2018-06-10 ENCOUNTER — Ambulatory Visit: Payer: 59 | Admitting: Urgent Care

## 2018-06-13 NOTE — Telephone Encounter (Signed)
Received a notification from [facility] through email on 06/06/18.   "Spoke with patient on 05/31/18 and went over financials. Patient advised that he cant do it at this time for financial reasons and would like be to contacted around the first of July to revisit."

## 2018-06-14 DIAGNOSIS — J3089 Other allergic rhinitis: Secondary | ICD-10-CM | POA: Diagnosis not present

## 2018-06-14 DIAGNOSIS — J3081 Allergic rhinitis due to animal (cat) (dog) hair and dander: Secondary | ICD-10-CM | POA: Diagnosis not present

## 2018-06-14 DIAGNOSIS — J301 Allergic rhinitis due to pollen: Secondary | ICD-10-CM | POA: Diagnosis not present

## 2018-06-17 ENCOUNTER — Encounter: Payer: Self-pay | Admitting: Urgent Care

## 2018-06-17 ENCOUNTER — Ambulatory Visit: Payer: 59 | Admitting: Urgent Care

## 2018-06-17 VITALS — BP 125/80 | HR 98 | Temp 98.0°F | Resp 16 | Ht 67.0 in | Wt 200.0 lb

## 2018-06-17 DIAGNOSIS — Z1159 Encounter for screening for other viral diseases: Secondary | ICD-10-CM

## 2018-06-17 DIAGNOSIS — I10 Essential (primary) hypertension: Secondary | ICD-10-CM | POA: Diagnosis not present

## 2018-06-17 DIAGNOSIS — R030 Elevated blood-pressure reading, without diagnosis of hypertension: Secondary | ICD-10-CM | POA: Diagnosis not present

## 2018-06-17 DIAGNOSIS — Z1211 Encounter for screening for malignant neoplasm of colon: Secondary | ICD-10-CM

## 2018-06-17 DIAGNOSIS — Z23 Encounter for immunization: Secondary | ICD-10-CM | POA: Diagnosis not present

## 2018-06-17 DIAGNOSIS — K219 Gastro-esophageal reflux disease without esophagitis: Secondary | ICD-10-CM | POA: Diagnosis not present

## 2018-06-17 DIAGNOSIS — Z114 Encounter for screening for human immunodeficiency virus [HIV]: Secondary | ICD-10-CM

## 2018-06-17 MED ORDER — LISINOPRIL-HYDROCHLOROTHIAZIDE 10-12.5 MG PO TABS: 1.0000 | ORAL_TABLET | Freq: Every day | ORAL | 1 refills | Status: DC

## 2018-06-17 MED ORDER — AMLODIPINE BESYLATE 10 MG PO TABS: 10.0000 mg | ORAL_TABLET | Freq: Every day | ORAL | 1 refills | Status: DC

## 2018-06-17 MED ORDER — RANITIDINE HCL 150 MG PO TABS: 150.0000 mg | ORAL_TABLET | Freq: Two times a day (BID) | ORAL | 5 refills | Status: DC

## 2018-06-17 NOTE — Patient Instructions (Addendum)
Hypertension Hypertension, commonly called high blood pressure, is when the force of blood pumping through the arteries is too strong. The arteries are the blood vessels that carry blood from the heart throughout the body. Hypertension forces the heart to work harder to pump blood and may cause arteries to become narrow or stiff. Having untreated or uncontrolled hypertension can cause heart attacks, strokes, kidney disease, and other problems. A blood pressure reading consists of a higher number over a lower number. Ideally, your blood pressure should be below 120/80. The first ("top") number is called the systolic pressure. It is a measure of the pressure in your arteries as your heart beats. The second ("bottom") number is called the diastolic pressure. It is a measure of the pressure in your arteries as the heart relaxes. What are the causes? The cause of this condition is not known. What increases the risk? Some risk factors for high blood pressure are under your control. Others are not. Factors you can change  Smoking.  Having type 2 diabetes mellitus, high cholesterol, or both.  Not getting enough exercise or physical activity.  Being overweight.  Having too much fat, sugar, calories, or salt (sodium) in your diet.  Drinking too much alcohol. Factors that are difficult or impossible to change  Having chronic kidney disease.  Having a family history of high blood pressure.  Age. Risk increases with age.  Race. You may be at higher risk if you are African-American.  Gender. Men are at higher risk than women before age 45. After age 65, women are at higher risk than men.  Having obstructive sleep apnea.  Stress. What are the signs or symptoms? Extremely high blood pressure (hypertensive crisis) may cause:  Headache.  Anxiety.  Shortness of breath.  Nosebleed.  Nausea and vomiting.  Severe chest pain.  Jerky movements you cannot control (seizures).  How is this  diagnosed? This condition is diagnosed by measuring your blood pressure while you are seated, with your arm resting on a surface. The cuff of the blood pressure monitor will be placed directly against the skin of your upper arm at the level of your heart. It should be measured at least twice using the same arm. Certain conditions can cause a difference in blood pressure between your right and left arms. Certain factors can cause blood pressure readings to be lower or higher than normal (elevated) for a short period of time:  When your blood pressure is higher when you are in a health care provider's office than when you are at home, this is called white coat hypertension. Most people with this condition do not need medicines.  When your blood pressure is higher at home than when you are in a health care provider's office, this is called masked hypertension. Most people with this condition may need medicines to control blood pressure.  If you have a high blood pressure reading during one visit or you have normal blood pressure with other risk factors:  You may be asked to return on a different day to have your blood pressure checked again.  You may be asked to monitor your blood pressure at home for 1 week or longer.  If you are diagnosed with hypertension, you may have other blood or imaging tests to help your health care provider understand your overall risk for other conditions. How is this treated? This condition is treated by making healthy lifestyle changes, such as eating healthy foods, exercising more, and reducing your alcohol intake. Your   health care provider may prescribe medicine if lifestyle changes are not enough to get your blood pressure under control, and if:  Your systolic blood pressure is above 130.  Your diastolic blood pressure is above 80.  Your personal target blood pressure may vary depending on your medical conditions, your age, and other factors. Follow these  instructions at home: Eating and drinking  Eat a diet that is high in fiber and potassium, and low in sodium, added sugar, and fat. An example eating plan is called the DASH (Dietary Approaches to Stop Hypertension) diet. To eat this way: ? Eat plenty of fresh fruits and vegetables. Try to fill half of your plate at each meal with fruits and vegetables. ? Eat whole grains, such as whole wheat pasta, brown rice, or whole grain bread. Fill about one quarter of your plate with whole grains. ? Eat or drink low-fat dairy products, such as skim milk or low-fat yogurt. ? Avoid fatty cuts of meat, processed or cured meats, and poultry with skin. Fill about one quarter of your plate with lean proteins, such as fish, chicken without skin, beans, eggs, and tofu. ? Avoid premade and processed foods. These tend to be higher in sodium, added sugar, and fat.  Reduce your daily sodium intake. Most people with hypertension should eat less than 1,500 mg of sodium a day.  Limit alcohol intake to no more than 1 drink a day for nonpregnant women and 2 drinks a day for men. One drink equals 12 oz of beer, 5 oz of wine, or 1 oz of hard liquor. Lifestyle  Work with your health care provider to maintain a healthy body weight or to lose weight. Ask what an ideal weight is for you.  Get at least 30 minutes of exercise that causes your heart to beat faster (aerobic exercise) most days of the week. Activities may include walking, swimming, or biking.  Include exercise to strengthen your muscles (resistance exercise), such as pilates or lifting weights, as part of your weekly exercise routine. Try to do these types of exercises for 30 minutes at least 3 days a week.  Do not use any products that contain nicotine or tobacco, such as cigarettes and e-cigarettes. If you need help quitting, ask your health care provider.  Monitor your blood pressure at home as told by your health care provider.  Keep all follow-up visits as  told by your health care provider. This is important. Medicines  Take over-the-counter and prescription medicines only as told by your health care provider. Follow directions carefully. Blood pressure medicines must be taken as prescribed.  Do not skip doses of blood pressure medicine. Doing this puts you at risk for problems and can make the medicine less effective.  Ask your health care provider about side effects or reactions to medicines that you should watch for. Contact a health care provider if:  You think you are having a reaction to a medicine you are taking.  You have headaches that keep coming back (recurring).  You feel dizzy.  You have swelling in your ankles.  You have trouble with your vision. Get help right away if:  You develop a severe headache or confusion.  You have unusual weakness or numbness.  You feel faint.  You have severe pain in your chest or abdomen.  You vomit repeatedly.  You have trouble breathing. Summary  Hypertension is when the force of blood pumping through your arteries is too strong. If this condition is not   controlled, it may put you at risk for serious complications.  Your personal target blood pressure may vary depending on your medical conditions, your age, and other factors. For most people, a normal blood pressure is less than 120/80.  Hypertension is treated with lifestyle changes, medicines, or a combination of both. Lifestyle changes include weight loss, eating a healthy, low-sodium diet, exercising more, and limiting alcohol. This information is not intended to replace advice given to you by your health care provider. Make sure you discuss any questions you have with your health care provider. Document Released: 11/30/2005 Document Revised: 10/28/2016 Document Reviewed: 10/28/2016 Elsevier Interactive Patient Education  2018 [employer]     Colorectal Cancer Screening Colorectal cancer screening is a group of tests  used to check for colorectal cancer. Colorectal refers to your colon and rectum. Your colon and rectum are located at the end of your large intestine and carry your bowel movements out of your body. Why is colorectal cancer screening done? It is common for abnormal growths (polyps) to form in the lining of your colon, especially as you get older. These polyps can be cancerous or become cancerous. If colorectal cancer is found at an early stage, it is treatable. Who should be screened for colorectal cancer? Screening is recommended for all adults at average risk starting at age 55. Tests may be recommended every 1 to 10 years. Your health care provider may recommend earlier or more frequent screening if you have:  A history of colorectal cancer or polyps.  A family member with a history of colorectal cancer or polyps.  Inflammatory bowel disease, such as ulcerative colitis or Crohn disease.  A type of hereditary colon cancer syndrome.  Colorectal cancer symptoms.  Types of screening tests There are several types of colorectal screening tests. They include:  Guaiac-based fecal occult blood testing.  Fecal immunochemical test (FIT).  Stool DNA test.  Barium enema.  Virtual colonoscopy.  Sigmoidoscopy. During this test, a sigmoidoscope is used to examine your rectum and lower colon. A sigmoidoscope is a flexible tube with a camera that is inserted through your anus into your rectum and lower colon.  Colonoscopy. During this test, a colonoscope is used to examine your entire colon. A colonoscope is a long, thin, flexible tube with a camera. This test examines your entire colon and rectum.  This information is not intended to replace advice given to you by your health care provider. Make sure you discuss any questions you have with your health care provider. Document Released: 05/20/2010 Document Revised: 07/09/2016 Document Reviewed: 03/08/2014 Elsevier Interactive Patient Education   [employer]2018 Elsevier Inc.

## 2018-06-17 NOTE — Progress Notes (Signed)
    MRN: [id number] DOB: 09/12/1961  Subjective:   Deno Windy CarinaMounkaila is a 57 y.o. adult presenting for follow up on Hypertension. Currently managed with amlodipine, lisinopril hydrochlorothiazide.  Patient has been making dietary modifications.  Denies dizziness, chronic headache, blurred vision, chest pain, shortness of breath, heart racing, palpitations, nausea, vomiting, abdominal pain, hematuria, lower leg swelling. Denies smoking cigarettes or drinking alcohol.   Neill has a current medication list which includes the following prescription(s): amlodipine, atorvastatin, lisinopril-hydrochlorothiazide, ranitidine, and sildenafil. Also has No Known Allergies.  Flor  has no past medical history on file. Also  has no past surgical history on file.  Objective:   Vitals: BP 125/80   Pulse 98   Temp 98 F (36.7 C) (Oral)   Resp 16   Ht 5\' 7"  (1.702 m)   Wt 200 lb (90.7 kg)   SpO2 98%   BMI 31.32 kg/m   BP Readings from Last 3 Encounters:  06/17/18 125/80  05/13/18 (!) 164/94  03/17/18 (!) 171/103    Physical Exam  Constitutional: He is oriented to person, place, and time. He appears well-developed and well-nourished.  Cardiovascular: Normal rate.  Pulmonary/Chest: Effort normal.  Neurological: He is alert and oriented to person, place, and time.   Assessment and Plan :   Essential hypertension - Plan: amLODipine (NORVASC) 10 MG tablet  Elevated blood pressure reading  Gastroesophageal reflux disease, esophagitis presence not specified - Plan: ranitidine (ZANTAC) 150 MG tablet  Colon cancer screening - Plan: Cologuard  Encounter for hepatitis C screening test for low risk patient - Plan: Hepatitis C antibody  Screening for HIV without presence of risk factors - Plan: HIV antibody  Patient has made significant improvement in his blood pressure.  I suspect this is largely a dietary issue we will keep medications on board for now.  We reviewed preventative medicine today  and patient was agreeable to updating his Tdap, Cologuard for cancer screening, screening for hepatitis C and HIV.  Labs pending.  We will follow-up in 6 months.  Refills provided sufficient to last until then.  Wallis BambergMario Mishayla Sliwinski, PA-C [facility] [phone] 06/17/2018  11:17 AM

## 2018-06-18 LAB — HEPATITIS C ANTIBODY: Hep C Virus Ab: <0.1 s/co ratio (ref 0.0–0.9)

## 2018-06-18 LAB — HIV ANTIBODY (ROUTINE TESTING W REFLEX): HIV Screen 4th Generation wRfx: NONREACTIVE

## 2018-06-20 ENCOUNTER — Telehealth: Payer: Self-pay | Admitting: Urgent Care

## 2018-06-20 NOTE — Telephone Encounter (Signed)
Copied from CRM [phone]#[id number]. Topic: Inquiry >> Jun 20, 2018  7:53 AM Leafy Roobinson, Norma J wrote: Reason for CRM: pt was seen on 06/17/18 and did not go to work on July 7 nor 7/8 due to left arm pain  where he got blood drawn from

## 2018-06-20 NOTE — Telephone Encounter (Signed)
Please see message about arm pain from the blood draw. Thank you

## 2018-06-21 ENCOUNTER — Encounter: Payer: Self-pay | Admitting: Urgent Care

## 2018-06-21 NOTE — Telephone Encounter (Signed)
Work note provided with statement that this does not qualify for short-term disability or FMLA.

## 2018-06-21 NOTE — Telephone Encounter (Signed)
Pt calling back to request a work not for Sunday, Monday, and Tues for arm pain due to the shot and lab draw. Pt states OK to put on Mychart

## 2018-06-21 NOTE — Telephone Encounter (Signed)
Noted  

## 2018-06-24 DIAGNOSIS — J301 Allergic rhinitis due to pollen: Secondary | ICD-10-CM | POA: Diagnosis not present

## 2018-06-24 DIAGNOSIS — J3081 Allergic rhinitis due to animal (cat) (dog) hair and dander: Secondary | ICD-10-CM | POA: Diagnosis not present

## 2018-06-24 DIAGNOSIS — J3089 Other allergic rhinitis: Secondary | ICD-10-CM | POA: Diagnosis not present

## 2018-06-29 DIAGNOSIS — J301 Allergic rhinitis due to pollen: Secondary | ICD-10-CM | POA: Diagnosis not present

## 2018-06-29 DIAGNOSIS — J3081 Allergic rhinitis due to animal (cat) (dog) hair and dander: Secondary | ICD-10-CM | POA: Diagnosis not present

## 2018-06-29 DIAGNOSIS — Z1211 Encounter for screening for malignant neoplasm of colon: Secondary | ICD-10-CM | POA: Diagnosis not present

## 2018-06-29 DIAGNOSIS — J3089 Other allergic rhinitis: Secondary | ICD-10-CM | POA: Diagnosis not present

## 2018-07-07 DIAGNOSIS — J301 Allergic rhinitis due to pollen: Secondary | ICD-10-CM | POA: Diagnosis not present

## 2018-07-07 DIAGNOSIS — J3081 Allergic rhinitis due to animal (cat) (dog) hair and dander: Secondary | ICD-10-CM | POA: Diagnosis not present

## 2018-07-07 DIAGNOSIS — J3089 Other allergic rhinitis: Secondary | ICD-10-CM | POA: Diagnosis not present

## 2018-07-11 ENCOUNTER — Encounter: Payer: Self-pay | Admitting: Urgent Care

## 2018-07-15 LAB — COLOGUARD: Cologuard: NEGATIVE

## 2018-07-18 DIAGNOSIS — J3081 Allergic rhinitis due to animal (cat) (dog) hair and dander: Secondary | ICD-10-CM | POA: Diagnosis not present

## 2018-07-18 DIAGNOSIS — H52203 Unspecified astigmatism, bilateral: Secondary | ICD-10-CM | POA: Diagnosis not present

## 2018-07-18 DIAGNOSIS — J301 Allergic rhinitis due to pollen: Secondary | ICD-10-CM | POA: Diagnosis not present

## 2018-07-18 DIAGNOSIS — H524 Presbyopia: Secondary | ICD-10-CM | POA: Diagnosis not present

## 2018-07-18 DIAGNOSIS — J3089 Other allergic rhinitis: Secondary | ICD-10-CM | POA: Diagnosis not present

## 2018-07-23 ENCOUNTER — Encounter: Payer: Self-pay | Admitting: Urgent Care

## 2018-07-26 MED ORDER — BENZONATATE 100 MG PO CAPS: 100.0000 mg | ORAL_CAPSULE | Freq: Three times a day (TID) | ORAL | 0 refills | Status: DC | PRN

## 2018-07-29 DIAGNOSIS — J3089 Other allergic rhinitis: Secondary | ICD-10-CM | POA: Diagnosis not present

## 2018-07-29 DIAGNOSIS — J301 Allergic rhinitis due to pollen: Secondary | ICD-10-CM | POA: Diagnosis not present

## 2018-07-29 DIAGNOSIS — J3081 Allergic rhinitis due to animal (cat) (dog) hair and dander: Secondary | ICD-10-CM | POA: Diagnosis not present

## 2018-08-04 DIAGNOSIS — J301 Allergic rhinitis due to pollen: Secondary | ICD-10-CM | POA: Diagnosis not present

## 2018-08-05 DIAGNOSIS — J301 Allergic rhinitis due to pollen: Secondary | ICD-10-CM | POA: Diagnosis not present

## 2018-08-05 DIAGNOSIS — J3089 Other allergic rhinitis: Secondary | ICD-10-CM | POA: Diagnosis not present

## 2018-08-05 DIAGNOSIS — J3081 Allergic rhinitis due to animal (cat) (dog) hair and dander: Secondary | ICD-10-CM | POA: Diagnosis not present

## 2018-08-06 ENCOUNTER — Other Ambulatory Visit: Payer: Self-pay | Admitting: Urgent Care

## 2018-08-06 DIAGNOSIS — I10 Essential (primary) hypertension: Secondary | ICD-10-CM

## 2018-08-12 DIAGNOSIS — J301 Allergic rhinitis due to pollen: Secondary | ICD-10-CM | POA: Diagnosis not present

## 2018-08-12 DIAGNOSIS — J3081 Allergic rhinitis due to animal (cat) (dog) hair and dander: Secondary | ICD-10-CM | POA: Diagnosis not present

## 2018-08-12 DIAGNOSIS — J3089 Other allergic rhinitis: Secondary | ICD-10-CM | POA: Diagnosis not present

## 2018-08-19 DIAGNOSIS — J3081 Allergic rhinitis due to animal (cat) (dog) hair and dander: Secondary | ICD-10-CM | POA: Diagnosis not present

## 2018-08-19 DIAGNOSIS — J3089 Other allergic rhinitis: Secondary | ICD-10-CM | POA: Diagnosis not present

## 2018-08-19 DIAGNOSIS — J301 Allergic rhinitis due to pollen: Secondary | ICD-10-CM | POA: Diagnosis not present

## 2018-08-25 DIAGNOSIS — J3089 Other allergic rhinitis: Secondary | ICD-10-CM | POA: Diagnosis not present

## 2018-08-25 DIAGNOSIS — J3081 Allergic rhinitis due to animal (cat) (dog) hair and dander: Secondary | ICD-10-CM | POA: Diagnosis not present

## 2018-08-25 DIAGNOSIS — J301 Allergic rhinitis due to pollen: Secondary | ICD-10-CM | POA: Diagnosis not present

## 2018-08-31 ENCOUNTER — Encounter: Payer: Self-pay | Admitting: Urgent Care

## 2018-08-31 ENCOUNTER — Telehealth: Payer: Self-pay | Admitting: Neurology

## 2018-08-31 ENCOUNTER — Ambulatory Visit: Payer: Self-pay | Admitting: Neurology

## 2018-08-31 NOTE — Telephone Encounter (Signed)
Pt called today and cancelled apt stating that he is having car problems. Patient was never started on CPAP which was what the apt was for

## 2018-09-01 ENCOUNTER — Encounter: Payer: Self-pay | Admitting: Neurology

## 2018-09-01 DIAGNOSIS — J3089 Other allergic rhinitis: Secondary | ICD-10-CM | POA: Diagnosis not present

## 2018-09-01 DIAGNOSIS — J3081 Allergic rhinitis due to animal (cat) (dog) hair and dander: Secondary | ICD-10-CM | POA: Diagnosis not present

## 2018-09-01 DIAGNOSIS — J301 Allergic rhinitis due to pollen: Secondary | ICD-10-CM | POA: Diagnosis not present

## 2018-09-03 ENCOUNTER — Encounter: Payer: Self-pay | Admitting: Emergency Medicine

## 2018-09-09 ENCOUNTER — Ambulatory Visit: Payer: 59

## 2018-09-09 DIAGNOSIS — J3081 Allergic rhinitis due to animal (cat) (dog) hair and dander: Secondary | ICD-10-CM | POA: Diagnosis not present

## 2018-09-09 DIAGNOSIS — J301 Allergic rhinitis due to pollen: Secondary | ICD-10-CM | POA: Diagnosis not present

## 2018-09-09 DIAGNOSIS — J3089 Other allergic rhinitis: Secondary | ICD-10-CM | POA: Diagnosis not present

## 2018-09-12 ENCOUNTER — Ambulatory Visit: Payer: 59 | Admitting: Emergency Medicine

## 2018-09-14 ENCOUNTER — Other Ambulatory Visit: Payer: Self-pay | Admitting: Family Medicine

## 2018-09-14 ENCOUNTER — Ambulatory Visit: Payer: 59 | Admitting: Family Medicine

## 2018-09-14 DIAGNOSIS — I10 Essential (primary) hypertension: Secondary | ICD-10-CM

## 2018-09-14 DIAGNOSIS — J3081 Allergic rhinitis due to animal (cat) (dog) hair and dander: Secondary | ICD-10-CM | POA: Diagnosis not present

## 2018-09-14 DIAGNOSIS — J301 Allergic rhinitis due to pollen: Secondary | ICD-10-CM | POA: Diagnosis not present

## 2018-09-14 DIAGNOSIS — G4710 Hypersomnia, unspecified: Secondary | ICD-10-CM

## 2018-09-14 DIAGNOSIS — R0681 Apnea, not elsewhere classified: Secondary | ICD-10-CM

## 2018-09-14 DIAGNOSIS — R0683 Snoring: Secondary | ICD-10-CM

## 2018-09-14 DIAGNOSIS — K219 Gastro-esophageal reflux disease without esophagitis: Secondary | ICD-10-CM

## 2018-09-14 DIAGNOSIS — G4730 Sleep apnea, unspecified: Secondary | ICD-10-CM

## 2018-09-14 DIAGNOSIS — J3089 Other allergic rhinitis: Secondary | ICD-10-CM | POA: Diagnosis not present

## 2018-09-23 DIAGNOSIS — J3081 Allergic rhinitis due to animal (cat) (dog) hair and dander: Secondary | ICD-10-CM | POA: Diagnosis not present

## 2018-09-23 DIAGNOSIS — J301 Allergic rhinitis due to pollen: Secondary | ICD-10-CM | POA: Diagnosis not present

## 2018-09-23 DIAGNOSIS — J3089 Other allergic rhinitis: Secondary | ICD-10-CM | POA: Diagnosis not present

## 2018-09-30 DIAGNOSIS — J3089 Other allergic rhinitis: Secondary | ICD-10-CM | POA: Diagnosis not present

## 2018-09-30 DIAGNOSIS — J3081 Allergic rhinitis due to animal (cat) (dog) hair and dander: Secondary | ICD-10-CM | POA: Diagnosis not present

## 2018-09-30 DIAGNOSIS — J301 Allergic rhinitis due to pollen: Secondary | ICD-10-CM | POA: Diagnosis not present

## 2018-10-04 DIAGNOSIS — J3081 Allergic rhinitis due to animal (cat) (dog) hair and dander: Secondary | ICD-10-CM | POA: Diagnosis not present

## 2018-10-04 DIAGNOSIS — J301 Allergic rhinitis due to pollen: Secondary | ICD-10-CM | POA: Diagnosis not present

## 2018-10-04 DIAGNOSIS — J3089 Other allergic rhinitis: Secondary | ICD-10-CM | POA: Diagnosis not present

## 2018-10-05 DIAGNOSIS — T7849XA Other allergy, initial encounter: Secondary | ICD-10-CM | POA: Diagnosis not present

## 2018-10-05 DIAGNOSIS — K219 Gastro-esophageal reflux disease without esophagitis: Secondary | ICD-10-CM | POA: Diagnosis not present

## 2018-10-05 DIAGNOSIS — R05 Cough: Secondary | ICD-10-CM | POA: Diagnosis not present

## 2018-10-14 ENCOUNTER — Ambulatory Visit: Payer: 59 | Admitting: Emergency Medicine

## 2018-10-14 DIAGNOSIS — J301 Allergic rhinitis due to pollen: Secondary | ICD-10-CM | POA: Diagnosis not present

## 2018-10-14 DIAGNOSIS — J3089 Other allergic rhinitis: Secondary | ICD-10-CM | POA: Diagnosis not present

## 2018-10-14 DIAGNOSIS — J3081 Allergic rhinitis due to animal (cat) (dog) hair and dander: Secondary | ICD-10-CM | POA: Diagnosis not present

## 2018-10-19 DIAGNOSIS — J3081 Allergic rhinitis due to animal (cat) (dog) hair and dander: Secondary | ICD-10-CM | POA: Diagnosis not present

## 2018-10-19 DIAGNOSIS — J3089 Other allergic rhinitis: Secondary | ICD-10-CM | POA: Diagnosis not present

## 2018-10-19 DIAGNOSIS — J301 Allergic rhinitis due to pollen: Secondary | ICD-10-CM | POA: Diagnosis not present

## 2018-10-27 ENCOUNTER — Ambulatory Visit: Payer: 59 | Admitting: Neurology

## 2018-10-27 DIAGNOSIS — J301 Allergic rhinitis due to pollen: Secondary | ICD-10-CM | POA: Diagnosis not present

## 2018-10-27 DIAGNOSIS — J3089 Other allergic rhinitis: Secondary | ICD-10-CM | POA: Diagnosis not present

## 2018-10-27 DIAGNOSIS — J3081 Allergic rhinitis due to animal (cat) (dog) hair and dander: Secondary | ICD-10-CM | POA: Diagnosis not present

## 2018-10-31 DIAGNOSIS — J209 Acute bronchitis, unspecified: Secondary | ICD-10-CM | POA: Diagnosis not present

## 2018-10-31 DIAGNOSIS — K219 Gastro-esophageal reflux disease without esophagitis: Secondary | ICD-10-CM | POA: Diagnosis not present

## 2018-10-31 DIAGNOSIS — I10 Essential (primary) hypertension: Secondary | ICD-10-CM | POA: Diagnosis not present

## 2018-11-01 ENCOUNTER — Ambulatory Visit: Payer: 59 | Admitting: Physician Assistant

## 2018-11-01 DIAGNOSIS — J3081 Allergic rhinitis due to animal (cat) (dog) hair and dander: Secondary | ICD-10-CM | POA: Diagnosis not present

## 2018-11-01 DIAGNOSIS — J301 Allergic rhinitis due to pollen: Secondary | ICD-10-CM | POA: Diagnosis not present

## 2018-11-01 DIAGNOSIS — J3089 Other allergic rhinitis: Secondary | ICD-10-CM | POA: Diagnosis not present

## 2018-11-16 DIAGNOSIS — J3081 Allergic rhinitis due to animal (cat) (dog) hair and dander: Secondary | ICD-10-CM | POA: Diagnosis not present

## 2018-11-16 DIAGNOSIS — J301 Allergic rhinitis due to pollen: Secondary | ICD-10-CM | POA: Diagnosis not present

## 2018-11-16 DIAGNOSIS — J3089 Other allergic rhinitis: Secondary | ICD-10-CM | POA: Diagnosis not present

## 2018-11-18 DIAGNOSIS — N529 Male erectile dysfunction, unspecified: Secondary | ICD-10-CM | POA: Diagnosis not present

## 2018-11-18 DIAGNOSIS — E7849 Other hyperlipidemia: Secondary | ICD-10-CM | POA: Diagnosis not present

## 2018-11-18 DIAGNOSIS — G4730 Sleep apnea, unspecified: Secondary | ICD-10-CM | POA: Diagnosis not present

## 2018-11-18 DIAGNOSIS — I10 Essential (primary) hypertension: Secondary | ICD-10-CM | POA: Diagnosis not present

## 2018-11-25 DIAGNOSIS — J301 Allergic rhinitis due to pollen: Secondary | ICD-10-CM | POA: Diagnosis not present

## 2018-11-25 DIAGNOSIS — J3089 Other allergic rhinitis: Secondary | ICD-10-CM | POA: Diagnosis not present

## 2018-11-25 DIAGNOSIS — J3081 Allergic rhinitis due to animal (cat) (dog) hair and dander: Secondary | ICD-10-CM | POA: Diagnosis not present

## 2018-11-28 DIAGNOSIS — J301 Allergic rhinitis due to pollen: Secondary | ICD-10-CM | POA: Diagnosis not present

## 2018-11-28 DIAGNOSIS — J3081 Allergic rhinitis due to animal (cat) (dog) hair and dander: Secondary | ICD-10-CM | POA: Diagnosis not present

## 2018-11-28 DIAGNOSIS — J3089 Other allergic rhinitis: Secondary | ICD-10-CM | POA: Diagnosis not present

## 2018-12-13 DIAGNOSIS — J301 Allergic rhinitis due to pollen: Secondary | ICD-10-CM | POA: Diagnosis not present

## 2018-12-13 DIAGNOSIS — J3081 Allergic rhinitis due to animal (cat) (dog) hair and dander: Secondary | ICD-10-CM | POA: Diagnosis not present

## 2018-12-13 DIAGNOSIS — J3089 Other allergic rhinitis: Secondary | ICD-10-CM | POA: Diagnosis not present

## 2018-12-16 ENCOUNTER — Ambulatory Visit: Payer: 59 | Admitting: Urgent Care

## 2018-12-22 DIAGNOSIS — J301 Allergic rhinitis due to pollen: Secondary | ICD-10-CM | POA: Diagnosis not present

## 2018-12-22 DIAGNOSIS — J3089 Other allergic rhinitis: Secondary | ICD-10-CM | POA: Diagnosis not present

## 2018-12-22 DIAGNOSIS — J3081 Allergic rhinitis due to animal (cat) (dog) hair and dander: Secondary | ICD-10-CM | POA: Diagnosis not present

## 2018-12-26 ENCOUNTER — Encounter: Payer: Self-pay | Admitting: Emergency Medicine

## 2018-12-26 ENCOUNTER — Ambulatory Visit: Payer: 59 | Admitting: Emergency Medicine

## 2018-12-26 ENCOUNTER — Other Ambulatory Visit: Payer: Self-pay

## 2018-12-26 ENCOUNTER — Ambulatory Visit (INDEPENDENT_AMBULATORY_CARE_PROVIDER_SITE_OTHER): Payer: 59

## 2018-12-26 ENCOUNTER — Telehealth: Payer: Self-pay | Admitting: *Deleted

## 2018-12-26 VITALS — BP 126/75 | HR 91 | Temp 98.6°F | Resp 16 | Ht 67.0 in | Wt 204.8 lb

## 2018-12-26 DIAGNOSIS — R05 Cough: Secondary | ICD-10-CM

## 2018-12-26 DIAGNOSIS — J301 Allergic rhinitis due to pollen: Secondary | ICD-10-CM | POA: Diagnosis not present

## 2018-12-26 DIAGNOSIS — R059 Cough, unspecified: Secondary | ICD-10-CM

## 2018-12-26 DIAGNOSIS — J3081 Allergic rhinitis due to animal (cat) (dog) hair and dander: Secondary | ICD-10-CM | POA: Diagnosis not present

## 2018-12-26 DIAGNOSIS — J3089 Other allergic rhinitis: Secondary | ICD-10-CM | POA: Diagnosis not present

## 2018-12-26 DIAGNOSIS — J22 Unspecified acute lower respiratory infection: Secondary | ICD-10-CM

## 2018-12-26 MED ORDER — DOXYCYCLINE HYCLATE 100 MG PO TABS: 100.0000 mg | ORAL_TABLET | Freq: Two times a day (BID) | ORAL | 0 refills | Status: DC

## 2018-12-26 MED ORDER — PROMETHAZINE-DM 6.25-15 MG/5ML PO SYRP: 5.0000 mL | ORAL_SOLUTION | Freq: Four times a day (QID) | ORAL | 0 refills | Status: DC | PRN

## 2018-12-26 MED ORDER — BENZONATATE 200 MG PO CAPS: 200.0000 mg | ORAL_CAPSULE | Freq: Two times a day (BID) | ORAL | 0 refills | Status: DC | PRN

## 2018-12-26 NOTE — Progress Notes (Signed)
Jesse Horton 58 y.o.   Chief Complaint  Patient presents with  . Back Pain    x  3 days upper area and coughing x 3 weeks    HISTORY OF PRESENT ILLNESS: This is a 58 y.o. adult complaining of dry cough for 3 weeks with chest congestion followed by upper back pain for the past 3 days.  Denies difficulty breathing or chest pain.  Non-smoker.  Denies wheezing.  No history of asthma or COPD.  Denies any other significant symptoms.  HPI   Prior to Admission medications   Medication Sig Start Date End Date Taking? Authorizing Provider  amLODipine (NORVASC) 10 MG tablet Take 1 tablet (10 mg total) by mouth daily. 06/17/18  Yes Wallis Bamberg, PA-C  atorvastatin (LIPITOR) 20 MG tablet Take 1 tablet (20 mg total) by mouth daily. 05/16/18  Yes Wallis Bamberg, PA-C  benzonatate (TESSALON) 100 MG capsule Take 1-2 capsules (100-200 mg total) by mouth 3 (three) times daily as needed. 07/26/18  Yes Wallis Bamberg, PA-C  ranitidine (ZANTAC) 150 MG tablet Take 1 tablet (150 mg total) by mouth 2 (two) times daily. 06/17/18  Yes Wallis Bamberg, PA-C  sildenafil (VIAGRA) 50 MG tablet Take 1 tablet (50 mg total) by mouth as needed for erectile dysfunction. Needs office visit 01/09/15  Yes Pick-Jacobs, John C, DO  lisinopril-hydrochlorothiazide (PRINZIDE,ZESTORETIC) 10-12.5 MG tablet Take 1 tablet by mouth daily. Patient not taking: Reported on 12/26/2018 06/17/18   Wallis Bamberg, PA-C    No Known Allergies  Patient Active Problem List   Diagnosis Date Noted  . Chronic allergic rhinitis 01/29/2018  . Allergic rhinitis due to pollen 01/29/2018  . Allergic rhinitis due to animal (cat) (dog) hair and dander 01/29/2018  . Erectile dysfunction due to arterial insufficiency 01/09/2015  . Elevated blood pressure 01/09/2015  . HTN (hypertension) 07/29/2012    No past medical history on file.  No past surgical history on file.  Social History   Socioeconomic History  . Marital status: Single    Spouse name: Not on file    . Number of children: Not on file  . Years of education: Not on file  . Highest education level: Not on file  Occupational History  . Not on file  Social Needs  . Financial resource strain: Not on file  . Food insecurity:    Worry: Not on file    Inability: Not on file  . Transportation needs:    Medical: Not on file    Non-medical: Not on file  Tobacco Use  . Smoking status: Former Smoker    Last attempt to quit: 12/29/1998    Years since quitting: 20.0  . Smokeless tobacco: Never Used  Substance and Sexual Activity  . Alcohol use: No  . Drug use: No  . Sexual activity: Never    Birth control/protection: Abstinence  Lifestyle  . Physical activity:    Days per week: Not on file    Minutes per session: Not on file  . Stress: Not on file  Relationships  . Social connections:    Talks on phone: Not on file    Gets together: Not on file    Attends religious service: Not on file    Active member of club or organization: Not on file    Attends meetings of clubs or organizations: Not on file    Relationship status: Not on file  . Intimate partner violence:    Fear of current or ex partner: Not on file  Emotionally abused: Not on file    Physically abused: Not on file    Forced sexual activity: Not on file  Other Topics Concern  . Not on file  Social History Narrative  . Not on file    No family history on file.   Review of Systems  Constitutional: Negative.  Negative for chills and fever.  HENT: Negative.  Negative for congestion, nosebleeds and sore throat.   Eyes: Negative.  Negative for blurred vision and double vision.  Respiratory: Positive for cough. Negative for hemoptysis, shortness of breath and wheezing.   Cardiovascular: Negative.  Negative for chest pain, palpitations and leg swelling.  Gastrointestinal: Negative for abdominal pain, blood in stool, diarrhea, melena, nausea and vomiting.  Genitourinary: Negative.   Musculoskeletal: Negative.  Negative  for back pain, myalgias and neck pain.  Skin: Negative.  Negative for rash.  Neurological: Negative.  Negative for dizziness and headaches.  Endo/Heme/Allergies: Negative.   All other systems reviewed and are negative.   Vitals:   12/26/18 1342  BP: 126/75  Pulse: 91  Resp: 16  Temp: 98.6 F (37 C)  SpO2: 98%    Physical Exam Vitals signs reviewed.  Constitutional:      Appearance: Normal appearance.  HENT:     Head: Normocephalic and atraumatic.     Nose: Nose normal.     Mouth/Throat:     Mouth: Mucous membranes are moist.     Pharynx: Oropharynx is clear.  Eyes:     Extraocular Movements: Extraocular movements intact.     Conjunctiva/sclera: Conjunctivae normal.     Pupils: Pupils are equal, round, and reactive to light.  Neck:     Musculoskeletal: Normal range of motion and neck supple.  Cardiovascular:     Rate and Rhythm: Normal rate and regular rhythm.     Heart sounds: Normal heart sounds.  Pulmonary:     Effort: Pulmonary effort is normal.     Breath sounds: Normal breath sounds.  Abdominal:     Palpations: Abdomen is soft.     Tenderness: There is no abdominal tenderness.  Musculoskeletal: Normal range of motion.  Lymphadenopathy:     Cervical: No cervical adenopathy.  Skin:    General: Skin is warm and dry.  Neurological:     General: No focal deficit present.     Mental Status: He is alert and oriented to person, place, and time.  Psychiatric:        Mood and Affect: Mood normal.        Behavior: Behavior normal.    Dg Chest 2 View  Result Date: 12/26/2018 CLINICAL DATA:  58 year old with a cough. EXAM: CHEST - 2 VIEW COMPARISON:  04/19/2010 FINDINGS: Coarse lung markings are suggestive for chronic changes. No focal airspace disease or pulmonary edema. Heart and mediastinum are within normal limits. No large pleural effusions. Bridging osteophytes in the thoracic spine. IMPRESSION: No active cardiopulmonary disease. Electronically Signed   By: Richarda OverlieAdam   Henn M.D.   On: 12/26/2018 14:08    A total of 25 minutes was spent in the room with the patient, greater than 50% of which was in counseling/coordination of care regarding diagnosis, treatment, medications, and need for follow-up if no better or worse.  ASSESSMENT & PLAN: Deane was seen today for back pain.  Diagnoses and all orders for this visit:  Cough -     DG Chest 2 View; Future -     benzonatate (TESSALON) 200 MG capsule; Take 1 capsule (  200 mg total) by mouth 2 (two) times daily as needed for cough. -     promethazine-dextromethorphan (PROMETHAZINE-DM) 6.25-15 MG/5ML syrup; Take 5 mLs by mouth 4 (four) times daily as needed for cough.  Lower respiratory infection -     doxycycline (VIBRA-TABS) 100 MG tablet; Take 1 tablet (100 mg total) by mouth 2 (two) times daily.    Patient Instructions       If you have lab work done today you will be contacted with your lab results within the next 2 weeks.  If you have not heard from [place] then please contact us. The fastest way to get your results is to register for My Chart.   IF you received an x-ray today, you will receive an invoice from [facility]. Please contact [facility] at [phone] with questions or concerns regarding your invoice.   IF you received labwork today, you will receive an invoice from [place]. Please contact [facility] at [phone] with questions or concerns regarding your invoice.   Our billing staff will not be able to assist you with questions regarding bills from these companies.  You will be contacted with the lab results as soon as they are available. The fastest way to get your results is to activate your My Chart account. Instructions are located on the last page of this paperwork. If you have not heard from [place] regarding the results in 2 weeks, please contact this office.     Cough, Adult  A cough helps to clear your throat and lungs. A cough may last only 2-3 weeks  (acute), or it may last longer than 8 weeks (chronic). Many different things can cause a cough. A cough may be a sign of an illness or another medical condition. Follow these instructions at home:  Pay attention to any changes in your cough.  Take medicines only as told by your doctor. ? If you were prescribed an antibiotic medicine, take it as told by your doctor. Do not stop taking it even if you start to feel better. ? Talk with your doctor before you try using a cough medicine.  Drink enough fluid to keep your pee (urine) clear or pale yellow.  If the air is dry, use a cold steam vaporizer or humidifier in your home.  Stay away from things that make you cough at work or at home.  If your cough is worse at night, try using extra pillows to raise your head up higher while you sleep.  Do not smoke, and try not to be around smoke. If you need help quitting, ask your doctor.  Do not have caffeine.  Do not drink alcohol.  Rest as needed. Contact a doctor if:  You have new problems (symptoms).  You cough up yellow fluid (pus).  Your cough does not get better after 2-3 weeks, or your cough gets worse.  Medicine does not help your cough and you are not sleeping well.  You have pain that gets worse or pain that is not helped with medicine.  You have a fever.  You are losing weight and you do not know why.  You have night sweats. Get help right away if:  You cough up blood.  You have trouble breathing.  Your heartbeat is very fast. This information is not intended to replace advice given to you by your health care provider. Make sure you discuss any questions you have with your health care provider. Document Released: 08/13/2011 Document Revised: 05/07/2016 Document Reviewed:  02/06/2015 Elsevier Interactive Patient Education  2019 [employer] Inc.      Edwina BarthMiguel Zita Ozimek, MD [facility]

## 2018-12-26 NOTE — Telephone Encounter (Signed)
Left message in voicemail mobile number for patient to return to the office for his out of work note. Patient returned for the note.

## 2018-12-26 NOTE — Patient Instructions (Addendum)
     If you have lab work done today you will be contacted with your lab results within the next 2 weeks.  If you have not heard from us then please contact us. The fastest way to get your results is to register for My Chart.   IF you received an x-ray today, you will receive an invoice from [facility]. Please contact [facility] at [phone] with questions or concerns regarding your invoice.   IF you received labwork today, you will receive an invoice from [facility]. Please contact [facility] at [phone] with questions or concerns regarding your invoice.   Our billing staff will not be able to assist you with questions regarding bills from these companies.  You will be contacted with the lab results as soon as they are available. The fastest way to get your results is to activate your My Chart account. Instructions are located on the last page of this paperwork. If you have not heard from us regarding the results in 2 weeks, please contact this office.     Cough, Adult  A cough helps to clear your throat and lungs. A cough may last only 2-3 weeks (acute), or it may last longer than 8 weeks (chronic). Many different things can cause a cough. A cough may be a sign of an illness or another medical condition. Follow these instructions at home:  Pay attention to any changes in your cough.  Take medicines only as told by your doctor. ? If you were prescribed an antibiotic medicine, take it as told by your doctor. Do not stop taking it even if you start to feel better. ? Talk with your doctor before you try using a cough medicine.  Drink enough fluid to keep your pee (urine) clear or pale yellow.  If the air is dry, use a cold steam vaporizer or humidifier in your home.  Stay away from things that make you cough at work or at home.  If your cough is worse at night, try using extra pillows to raise your head up higher while you sleep.  Do not smoke, and try not to  be around smoke. If you need help quitting, ask your doctor.  Do not have caffeine.  Do not drink alcohol.  Rest as needed. Contact a doctor if:  You have new problems (symptoms).  You cough up yellow fluid (pus).  Your cough does not get better after 2-3 weeks, or your cough gets worse.  Medicine does not help your cough and you are not sleeping well.  You have pain that gets worse or pain that is not helped with medicine.  You have a fever.  You are losing weight and you do not know why.  You have night sweats. Get help right away if:  You cough up blood.  You have trouble breathing.  Your heartbeat is very fast. This information is not intended to replace advice given to you by your health care provider. Make sure you discuss any questions you have with your health care provider. Document Released: 08/13/2011 Document Revised: 05/07/2016 Document Reviewed: 02/06/2015 Elsevier Interactive Patient Education  2019 [employer] Inc[employer]  

## 2018-12-30 DIAGNOSIS — J3081 Allergic rhinitis due to animal (cat) (dog) hair and dander: Secondary | ICD-10-CM | POA: Diagnosis not present

## 2018-12-30 DIAGNOSIS — R05 Cough: Secondary | ICD-10-CM | POA: Diagnosis not present

## 2018-12-30 DIAGNOSIS — J3089 Other allergic rhinitis: Secondary | ICD-10-CM | POA: Diagnosis not present

## 2018-12-30 DIAGNOSIS — J301 Allergic rhinitis due to pollen: Secondary | ICD-10-CM | POA: Diagnosis not present

## 2019-01-05 DIAGNOSIS — J3089 Other allergic rhinitis: Secondary | ICD-10-CM | POA: Diagnosis not present

## 2019-01-05 DIAGNOSIS — J3081 Allergic rhinitis due to animal (cat) (dog) hair and dander: Secondary | ICD-10-CM | POA: Diagnosis not present

## 2019-01-05 DIAGNOSIS — J301 Allergic rhinitis due to pollen: Secondary | ICD-10-CM | POA: Diagnosis not present

## 2019-01-09 ENCOUNTER — Encounter (HOSPITAL_COMMUNITY): Payer: Self-pay

## 2019-01-09 ENCOUNTER — Emergency Department (HOSPITAL_COMMUNITY): Payer: 59

## 2019-01-09 ENCOUNTER — Emergency Department (HOSPITAL_COMMUNITY)
Admission: EM | Admit: 2019-01-09 | Discharge: 2019-01-10 | Disposition: A | Discharge status: Home / Self Care | Payer: 59 | Attending: Emergency Medicine | Admitting: Emergency Medicine

## 2019-01-09 DIAGNOSIS — Z79899 Other long term (current) drug therapy: Secondary | ICD-10-CM | POA: Diagnosis not present

## 2019-01-09 DIAGNOSIS — Z87891 Personal history of nicotine dependence: Secondary | ICD-10-CM | POA: Insufficient documentation

## 2019-01-09 DIAGNOSIS — I10 Essential (primary) hypertension: Secondary | ICD-10-CM | POA: Insufficient documentation

## 2019-01-09 DIAGNOSIS — R0789 Other chest pain: Secondary | ICD-10-CM | POA: Insufficient documentation

## 2019-01-09 DIAGNOSIS — M546 Pain in thoracic spine: Secondary | ICD-10-CM | POA: Diagnosis not present

## 2019-01-09 DIAGNOSIS — J3089 Other allergic rhinitis: Secondary | ICD-10-CM | POA: Diagnosis not present

## 2019-01-09 DIAGNOSIS — R079 Chest pain, unspecified: Secondary | ICD-10-CM | POA: Diagnosis not present

## 2019-01-09 DIAGNOSIS — R0602 Shortness of breath: Secondary | ICD-10-CM | POA: Diagnosis not present

## 2019-01-09 DIAGNOSIS — J3081 Allergic rhinitis due to animal (cat) (dog) hair and dander: Secondary | ICD-10-CM | POA: Diagnosis not present

## 2019-01-09 DIAGNOSIS — J301 Allergic rhinitis due to pollen: Secondary | ICD-10-CM | POA: Diagnosis not present

## 2019-01-09 HISTORY — DX: Essential (primary) hypertension: I10

## 2019-01-09 HISTORY — DX: Pure hypercholesterolemia, unspecified: E78.00

## 2019-01-09 LAB — CBC
HCT: 46.4 % (ref 39.0–52.0)
Hemoglobin: 15.4 g/dL (ref 13.0–17.0)
MCH: 28.4 pg (ref 26.0–34.0)
MCHC: 33.2 g/dL (ref 30.0–36.0)
MCV: 85.6 fL (ref 80.0–100.0)
Platelets: 235 10*3/uL (ref 150–400)
RBC: 5.42 MIL/uL (ref 4.22–5.81)
RDW: 13.5 % (ref 11.5–15.5)
WBC: 6.8 10*3/uL (ref 4.0–10.5)
nRBC: 0 % (ref 0.0–0.2)

## 2019-01-09 LAB — BASIC METABOLIC PANEL
Anion gap: 12 (ref 5–15)
BUN: 18 mg/dL (ref 6–20)
CO2: 22 mmol/L (ref 22–32)
Calcium: 9.5 mg/dL (ref 8.9–10.3)
Chloride: 102 mmol/L (ref 98–111)
Creatinine, Ser: 1.2 mg/dL (ref 0.61–1.24)
GFR calc Af Amer: >60 mL/min (ref >60)
GFR calc non Af Amer: >60 mL/min (ref >60)
Glucose, Bld: 102 mg/dL — ABNORMAL HIGH (ref 70–99)
Potassium: 3.9 mmol/L (ref 3.5–5.1)
Sodium: 136 mmol/L (ref 135–145)

## 2019-01-09 LAB — I-STAT TROPONIN, ED: Troponin i, poc: 0.01 ng/mL (ref 0.00–0.08)

## 2019-01-09 MED ORDER — SODIUM CHLORIDE 0.9% FLUSH: 3.0000 mL | Freq: Once | INTRAVENOUS | Status: DC

## 2019-01-09 NOTE — ED Triage Notes (Signed)
Pt reports chest pain for 2 weeks, pain radiates to his back, went to PCP but sent here due to abnormal EKG. EKG from PCP states NSR nonspecific t wave abnormality. Pt a.o,nad noted

## 2019-01-09 NOTE — ED Notes (Signed)
Patient transported to X-ray 

## 2019-01-10 LAB — HEPATIC FUNCTION PANEL
ALT: 22 U/L (ref 0–44)
AST: 25 U/L (ref 15–41)
Albumin: 3.8 g/dL (ref 3.5–5.0)
Alkaline Phosphatase: 48 U/L (ref 38–126)
Bilirubin, Direct: <0.1 mg/dL (ref 0.0–0.2)
Total Bilirubin: 0.8 mg/dL (ref 0.3–1.2)
Total Protein: 7.5 g/dL (ref 6.5–8.1)

## 2019-01-10 LAB — D-DIMER, QUANTITATIVE: D-Dimer, Quant: 0.29 ug/mL-FEU (ref 0.00–0.50)

## 2019-01-10 LAB — LIPASE, BLOOD: Lipase: 35 U/L (ref 11–51)

## 2019-01-10 LAB — I-STAT TROPONIN, ED: Troponin i, poc: 0 ng/mL (ref 0.00–0.08)

## 2019-01-10 MED ORDER — ALUM & MAG HYDROXIDE-SIMETH 200-200-20 MG/5ML PO SUSP
30.0000 mL | Freq: Once | ORAL | Status: AC
  Administered 2019-01-10: 30 mL via ORAL
  Filled 2019-01-10: qty 30

## 2019-01-10 MED ORDER — OMEPRAZOLE 20 MG PO CPDR: 20.0000 mg | DELAYED_RELEASE_CAPSULE | Freq: Every day | ORAL | 0 refills | Status: AC

## 2019-01-10 NOTE — ED Provider Notes (Signed)
[facility] Provider Note   CSN: [id number] Arrival date & time: 01/09/19  1630     History   Chief Complaint Chief Complaint  Patient presents with  . Chest Pain    HPI Hieu Windy CarinaMounkaila is a 58 y.o. adult.  Patient with history of hypertension, hyperlipidemia, former smoker sent from urgent care with 6417-month history of intermittent chest tightness that is been coming and going.  He reports he is had constant "pins-and-needles feeling" with pressure and tightness in the center of his chest for the past 4 days.  This pain radiates to his mid back.  It does not radiate to his arm or his neck.  Is associate with some shortness of breath.  No nausea, vomiting, diaphoresis or syncope.  Urgent care sent him to the ED because his EKG was thought to be abnormal.  He denies any cardiac history.  He is never had a stress test.  He denies any pleuritic or exertional nature of the pain.  He does have a history of acid reflux disease but does not think this feels similar. He reports the pain seems to be worse when he goes outside in the cold weather but is not exertional.  He reports there is been no change in the pain for the past 4 days though he feels that is starting to get somewhat better than what it was last week.  The history is provided by the patient.  Chest Pain  Associated symptoms: shortness of breath   Associated symptoms: no abdominal pain, no dizziness, no fatigue, no fever, no headache, no nausea, no vomiting and no weakness     Past Medical History:  Diagnosis Date  . High cholesterol   . Hypertension     Patient Active Problem List   Diagnosis Date Noted  . Cough 12/26/2018  . Lower respiratory infection 12/26/2018  . Chronic allergic rhinitis 01/29/2018  . Allergic rhinitis due to pollen 01/29/2018  . Allergic rhinitis due to animal (cat) (dog) hair and dander 01/29/2018  . Erectile dysfunction due to arterial insufficiency  01/09/2015  . Elevated blood pressure 01/09/2015  . HTN (hypertension) 07/29/2012    History reviewed. No pertinent surgical history.   OB History   No obstetric history on file.      Home Medications    Prior to Admission medications   Medication Sig Start Date End Date Taking? Authorizing Provider  amLODipine (NORVASC) 10 MG tablet Take 1 tablet (10 mg total) by mouth daily. 06/17/18  Yes Wallis BambergMani, Mario, PA-C  atorvastatin (LIPITOR) 20 MG tablet Take 1 tablet (20 mg total) by mouth daily. 05/16/18  Yes Wallis BambergMani, Mario, PA-C  azelastine (OPTIVAR) 0.05 % ophthalmic solution Place 1 drop into both eyes 2 (two) times daily.   Yes [provider]  benzonatate (TESSALON) 200 MG capsule Take 1 capsule (200 mg total) by mouth 2 (two) times daily as needed for cough. 12/26/18  Yes Sagardia, Eilleen KempfMiguel Jose, MD  doxycycline (VIBRA-TABS) 100 MG tablet Take 1 tablet (100 mg total) by mouth 2 (two) times daily. 12/26/18  Yes Sagardia, Eilleen KempfMiguel Jose, MD  lisinopril-hydrochlorothiazide (PRINZIDE,ZESTORETIC) 10-12.5 MG tablet Take 1 tablet by mouth daily. 06/17/18  Yes Wallis BambergMani, Mario, PA-C  montelukast (SINGULAIR) 10 MG tablet Take 10 mg by mouth at bedtime.   Yes [provider]  omeprazole (PRILOSEC) 40 MG capsule Take 40 mg by mouth daily.   Yes [provider]  promethazine-dextromethorphan (PROMETHAZINE-DM) 6.25-15 MG/5ML syrup Take 5 mLs by  mouth 4 (four) times daily as needed for cough. Patient not taking: Reported on 01/10/2019 12/26/18   Georgina Quint, MD  ranitidine (ZANTAC) 150 MG tablet Take 1 tablet (150 mg total) by mouth 2 (two) times daily. Patient not taking: Reported on 01/10/2019 06/17/18   Wallis Bamberg, PA-C  sildenafil (VIAGRA) 50 MG tablet Take 1 tablet (50 mg total) by mouth as needed for erectile dysfunction. Needs office visit Patient not taking: Reported on 01/10/2019 01/09/15   Pick-Jacobs, Everardo All, DO    Family History No family history on file.  Social  History Social History   Tobacco Use  . Smoking status: Former Smoker    Last attempt to quit: 12/29/1998    Years since quitting: 20.0  . Smokeless tobacco: Never Used  Substance Use Topics  . Alcohol use: No  . Drug use: No     Allergies   Patient has no known allergies.   Review of Systems Review of Systems  Constitutional: Negative for activity change, appetite change, fatigue and fever.  HENT: Negative for congestion and rhinorrhea.   Respiratory: Positive for chest tightness and shortness of breath.   Cardiovascular: Positive for chest pain. Negative for leg swelling.  Gastrointestinal: Negative for abdominal pain, nausea and vomiting.  Genitourinary: Negative for dysuria and genital sores.  Neurological: Negative for dizziness, weakness, light-headedness and headaches.    all other systems are negative except as noted in the HPI and PMH.    Physical Exam Updated Vital Signs BP (!) 148/99   Pulse 85   Temp 98.5 F (36.9 C) (Oral)   Resp (!) 21   SpO2 98%   Physical Exam Vitals signs and nursing note reviewed.  Constitutional:      General: He is not in acute distress.    Appearance: He is well-developed.  HENT:     Head: Normocephalic and atraumatic.     Mouth/Throat:     Pharynx: No oropharyngeal exudate.  Eyes:     Conjunctiva/sclera: Conjunctivae normal.     Pupils: Pupils are equal, round, and reactive to light.  Neck:     Musculoskeletal: Normal range of motion and neck supple.     Comments: No meningismus. Cardiovascular:     Rate and Rhythm: Normal rate and regular rhythm.     Heart sounds: Normal heart sounds. No murmur.  Pulmonary:     Effort: Pulmonary effort is normal. No respiratory distress.     Breath sounds: Normal breath sounds.  Chest:     Chest wall: No tenderness.  Abdominal:     Palpations: Abdomen is soft.     Tenderness: There is no abdominal tenderness. There is no guarding or rebound.  Musculoskeletal: Normal range of  motion.        General: No tenderness.  Skin:    General: Skin is warm.  Neurological:     Mental Status: He is alert and oriented to person, place, and time.     Cranial Nerves: No cranial nerve deficit.     Motor: No abnormal muscle tone.     Coordination: Coordination normal.     Comments: No ataxia on finger to nose bilaterally. No pronator drift. 5/5 strength throughout. CN 2-12 intact.Equal grip strength. Sensation intact.   Psychiatric:        Behavior: Behavior normal.      ED Treatments / Results  Labs (all labs ordered are listed, but only abnormal results are displayed) Labs Reviewed  BASIC METABOLIC PANEL - Abnormal;  Notable for the following components:      Result Value   Glucose, Bld 102 (*)    All other components within normal limits  CBC  LIPASE, BLOOD  HEPATIC FUNCTION PANEL  D-DIMER, QUANTITATIVE (NOT AT [facility])  I-STAT TROPONIN, ED  I-STAT TROPONIN, ED    EKG EKG Interpretation  Date/Time:  Monday January 09 2019 17:01:30 EST Ventricular Rate:  95 PR Interval:  148 QRS Duration: 88 QT Interval:  332 QTC Calculation: 417 R Axis:   74 Text Interpretation:  Normal sinus rhythm Normal ECG No previous ECGs available Confirmed by Glynn Octave [phone]) on 01/10/2019 1:37:13 AM   Radiology Dg Chest 2 View  Result Date: 01/09/2019 CLINICAL DATA:  Chest and left arm pain for 2 months.  Hypertension. EXAM: CHEST - 2 VIEW COMPARISON:  12/26/2018 FINDINGS: The heart size and mediastinal contours are within normal limits. Both lungs are clear. The visualized skeletal structures are unremarkable. IMPRESSION: No active cardiopulmonary disease. Electronically Signed   By: Myles Rosenthal M.D.   On: 01/09/2019 17:29    Procedures Procedures (including critical care time)  Medications Ordered in ED Medications  sodium chloride flush (NS) 0.9 % injection 3 mL (has no administration in time range)  alum & mag hydroxide-simeth (MAALOX/MYLANTA) 200-200-20 MG/5ML  suspension 30 mL (has no administration in time range)     Initial Impression / Assessment and Plan / ED Course  I have reviewed the triage vital signs and the nursing notes.  Pertinent labs & imaging results that were available during my care of the patient were reviewed by me and considered in my medical decision making (see chart for details).    2 months of intermittent chest pain and pressure and is been constant for the past 4 days.  Associated with some shortness of breath.  EKG shows no acute ischemia with nonspecific T wave changes.  Troponin negative.  LFTs and lipase are normal.  D-dimer is negative.  Patient's chest pain completely resolved after Mylanta solution.  Ongoing pain for the past 4 days with negative serial troponins.  Low suspicion for ACS, PE, aortic dissection.  Favor GI origin of his pain.  We will start PPI, avoid NSAIDs, caffeine, spicy foods, alcohol.  Follow-up with PCP for stress test.  Return to the ED if pain becomes exertional, associated with shortness of breath, nausea, vomiting or other concerns.   Final Clinical Impressions(s) / ED Diagnoses   Final diagnoses:  Atypical chest pain    ED Discharge Orders    None       Pelagia Iacobucci, Jeannett Senior, MD 01/10/19 [phone]

## 2019-01-10 NOTE — Discharge Instructions (Addendum)
There is no evidence of heart attack or blood clot in the lung.  Follow-up with your doctor and take the stomach medication as prescribed.  You should avoid alcohol, caffeine, NSAID medications, spicy food. You should have a stress test performed by your doctor.  Return to the ED if your chest pain becomes exertional, associated with shortness of breath, vomiting, sweating or other concerns.

## 2019-01-18 DIAGNOSIS — J301 Allergic rhinitis due to pollen: Secondary | ICD-10-CM | POA: Diagnosis not present

## 2019-01-19 DIAGNOSIS — J3081 Allergic rhinitis due to animal (cat) (dog) hair and dander: Secondary | ICD-10-CM | POA: Diagnosis not present

## 2019-01-19 DIAGNOSIS — J3089 Other allergic rhinitis: Secondary | ICD-10-CM | POA: Diagnosis not present

## 2019-01-20 DIAGNOSIS — J3081 Allergic rhinitis due to animal (cat) (dog) hair and dander: Secondary | ICD-10-CM | POA: Diagnosis not present

## 2019-01-20 DIAGNOSIS — J301 Allergic rhinitis due to pollen: Secondary | ICD-10-CM | POA: Diagnosis not present

## 2019-01-20 DIAGNOSIS — J3089 Other allergic rhinitis: Secondary | ICD-10-CM | POA: Diagnosis not present

## 2019-01-27 DIAGNOSIS — J3081 Allergic rhinitis due to animal (cat) (dog) hair and dander: Secondary | ICD-10-CM | POA: Diagnosis not present

## 2019-01-27 DIAGNOSIS — J301 Allergic rhinitis due to pollen: Secondary | ICD-10-CM | POA: Diagnosis not present

## 2019-01-27 DIAGNOSIS — J3089 Other allergic rhinitis: Secondary | ICD-10-CM | POA: Diagnosis not present

## 2019-01-28 ENCOUNTER — Other Ambulatory Visit: Payer: Self-pay | Admitting: Emergency Medicine

## 2019-01-28 DIAGNOSIS — R059 Cough, unspecified: Secondary | ICD-10-CM

## 2019-01-28 DIAGNOSIS — R05 Cough: Secondary | ICD-10-CM

## 2019-01-30 NOTE — Telephone Encounter (Signed)
Requested medication (s) are due for refill today: yes  Requested medication (s) are on the active medication list: yes  Last refill:  12/26/2018  Future visit scheduled: no  Notes to clinic:    Requested Prescriptions  Pending Prescriptions Disp Refills   benzonatate (TESSALON) 200 MG capsule [Pharmacy Med Name: BENZONATATE 200MG  CAPSULES] 20 capsule 0    Sig: TAKE 1 CAPSULE(200 MG) BY MOUTH TWICE DAILY AS NEEDED FOR COUGH     Ear, Nose, and Throat:  Antitussives/Expectorants Passed - 01/30/2019 10:01 AM      Passed - Valid encounter within last 12 months    Recent Outpatient Visits          1 month ago Cough   Primary Care at [facility], Eilleen Kempf, [place]   7 months ago Essential hypertension   Primary Care at [facility], [place], [place]   8 months ago Essential hypertension   Primary Care at [facility], [place], [place]   11 months ago Essential hypertension   Primary Care at [place], [place], [place]   1 year ago Essential hypertension   Primary Care at [place], [place], [place]

## 2019-01-30 NOTE — Telephone Encounter (Signed)
Pt requesting a refill for tessalon caps. Attempted to call him to discuss his symptoms. No answer, left message to call the office back.

## 2019-02-03 DIAGNOSIS — J3089 Other allergic rhinitis: Secondary | ICD-10-CM | POA: Diagnosis not present

## 2019-02-03 DIAGNOSIS — J301 Allergic rhinitis due to pollen: Secondary | ICD-10-CM | POA: Diagnosis not present

## 2019-02-03 DIAGNOSIS — J3081 Allergic rhinitis due to animal (cat) (dog) hair and dander: Secondary | ICD-10-CM | POA: Diagnosis not present

## 2019-02-06 ENCOUNTER — Ambulatory Visit: Payer: 59 | Admitting: Emergency Medicine

## 2019-02-10 DIAGNOSIS — J3081 Allergic rhinitis due to animal (cat) (dog) hair and dander: Secondary | ICD-10-CM | POA: Diagnosis not present

## 2019-02-10 DIAGNOSIS — J301 Allergic rhinitis due to pollen: Secondary | ICD-10-CM | POA: Diagnosis not present

## 2019-02-10 DIAGNOSIS — J3089 Other allergic rhinitis: Secondary | ICD-10-CM | POA: Diagnosis not present

## 2019-02-17 ENCOUNTER — Other Ambulatory Visit: Payer: Self-pay | Admitting: Urgent Care

## 2019-02-17 DIAGNOSIS — J3081 Allergic rhinitis due to animal (cat) (dog) hair and dander: Secondary | ICD-10-CM | POA: Diagnosis not present

## 2019-02-17 DIAGNOSIS — J3089 Other allergic rhinitis: Secondary | ICD-10-CM | POA: Diagnosis not present

## 2019-02-17 DIAGNOSIS — I10 Essential (primary) hypertension: Secondary | ICD-10-CM

## 2019-02-17 DIAGNOSIS — J301 Allergic rhinitis due to pollen: Secondary | ICD-10-CM | POA: Diagnosis not present

## 2019-02-18 ENCOUNTER — Other Ambulatory Visit: Payer: Self-pay | Admitting: Urgent Care

## 2019-02-24 DIAGNOSIS — J3081 Allergic rhinitis due to animal (cat) (dog) hair and dander: Secondary | ICD-10-CM | POA: Diagnosis not present

## 2019-02-24 DIAGNOSIS — J301 Allergic rhinitis due to pollen: Secondary | ICD-10-CM | POA: Diagnosis not present

## 2019-02-24 DIAGNOSIS — J3089 Other allergic rhinitis: Secondary | ICD-10-CM | POA: Diagnosis not present

## 2019-02-27 DIAGNOSIS — R05 Cough: Secondary | ICD-10-CM | POA: Diagnosis not present

## 2019-02-27 DIAGNOSIS — M546 Pain in thoracic spine: Secondary | ICD-10-CM | POA: Diagnosis not present

## 2019-03-01 DIAGNOSIS — J3089 Other allergic rhinitis: Secondary | ICD-10-CM | POA: Diagnosis not present

## 2019-03-01 DIAGNOSIS — J301 Allergic rhinitis due to pollen: Secondary | ICD-10-CM | POA: Diagnosis not present

## 2019-03-01 DIAGNOSIS — J3081 Allergic rhinitis due to animal (cat) (dog) hair and dander: Secondary | ICD-10-CM | POA: Diagnosis not present

## 2019-03-17 DIAGNOSIS — J3081 Allergic rhinitis due to animal (cat) (dog) hair and dander: Secondary | ICD-10-CM | POA: Diagnosis not present

## 2019-03-17 DIAGNOSIS — J3089 Other allergic rhinitis: Secondary | ICD-10-CM | POA: Diagnosis not present

## 2019-03-17 DIAGNOSIS — J301 Allergic rhinitis due to pollen: Secondary | ICD-10-CM | POA: Diagnosis not present

## 2019-03-21 ENCOUNTER — Ambulatory Visit: Payer: Self-pay | Admitting: *Deleted

## 2019-03-21 NOTE — Telephone Encounter (Signed)
Spoke with pt about concerns and changed appointment to OV, he verbalized understanding.

## 2019-03-21 NOTE — Telephone Encounter (Signed)
Pt having rectal bleeding last night. He stated the commode water was bright red. He did not look closely so could not tell if he passed clots or stool. He has a hemorrhoid and has problems with constipation. He denies dizziness, or weakness.  Advised to call back for increase bleeding, abd pain, fever or about to pass out. Pt voiced understanding. Advised that appointments are virtual or telephone calls. He stated that he can not do the virtual visit and stated he has an appointment to go to the office tomorrow. Advised patient that per the schedule it is a virtual visit.  So he would like a call back regarding this encounter and his appointment for tomorrow. Routing to PCP for review and appointment.  Reason for Disposition . MODERATE rectal bleeding (small blood clots, passing blood without stool, or toilet water turns red)  Answer Assessment - Initial Assessment Questions 1. APPEARANCE of BLOOD: "What color is it?" "Is it passed separately, on the surface of the stool, or mixed in with the stool?"      Bright red 2. AMOUNT: "How much blood was passed?"      Commode of water was red 3. FREQUENCY: "How many times has blood been passed with the stools?"      Just last night once 4. ONSET: "When was the blood first seen in the stools?" (Days or weeks)      yes 5. DIARRHEA: "Is there also some diarrhea?" If so, ask: "How many diarrhea stools were passed in past 24 hours?"      no 6. CONSTIPATION: "Do you have constipation?" If so, "How bad is it?"     Yes has to strain 7. RECURRENT SYMPTOMS: "Have you had blood in your stools before?" If so, ask: "When was the last time?" and "What happened that time?"      no 8. BLOOD THINNERS: "Do you take any blood thinners?" (e.g., Coumadin/warfarin, Pradaxa/dabigatran, aspirin)     no 9. OTHER SYMPTOMS: "Do you have any other symptoms?"  (e.g., abdominal pain, vomiting, dizziness, fever)     no 10. PREGNANCY: "Is there any chance you are pregnant?"  "When was your last menstrual period?"       n/a  Protocols used: RECTAL BLEEDING-A-AH

## 2019-03-22 ENCOUNTER — Ambulatory Visit (INDEPENDENT_AMBULATORY_CARE_PROVIDER_SITE_OTHER): Payer: 59 | Admitting: Emergency Medicine

## 2019-03-22 ENCOUNTER — Other Ambulatory Visit: Payer: Self-pay

## 2019-03-22 ENCOUNTER — Encounter: Payer: Self-pay | Admitting: Emergency Medicine

## 2019-03-22 VITALS — BP 124/56 | HR 128 | Temp 98.2°F | Resp 16 | Ht 67.0 in | Wt 205.8 lb

## 2019-03-22 DIAGNOSIS — K644 Residual hemorrhoidal skin tags: Secondary | ICD-10-CM

## 2019-03-22 DIAGNOSIS — I10 Essential (primary) hypertension: Secondary | ICD-10-CM

## 2019-03-22 DIAGNOSIS — J3089 Other allergic rhinitis: Secondary | ICD-10-CM | POA: Diagnosis not present

## 2019-03-22 DIAGNOSIS — J301 Allergic rhinitis due to pollen: Secondary | ICD-10-CM | POA: Diagnosis not present

## 2019-03-22 DIAGNOSIS — J3081 Allergic rhinitis due to animal (cat) (dog) hair and dander: Secondary | ICD-10-CM | POA: Diagnosis not present

## 2019-03-22 MED ORDER — HYDROCORTISONE 2.5 % RE CREA: 1.0000 "application " | TOPICAL_CREAM | Freq: Two times a day (BID) | RECTAL | 0 refills | Status: DC

## 2019-03-22 NOTE — Progress Notes (Signed)
Jesse Horton 58 y.o.   Chief Complaint  Patient presents with   Hemorrhoids    x 1 week and bleeding started 2 days ago   Dizziness    PER PATIENT FEELS THIS WHEN GETTING UP X3 DAYS    HISTORY OF PRESENT ILLNESS: This is a 58 y.o. adult complaining of painful and bleeding hemorrhoids for the past week.  Intermittent symptoms.  No other significant symptoms.  HPI   Prior to Admission medications   Medication Sig Start Date End Date Taking? Authorizing Provider  amLODipine (NORVASC) 10 MG tablet TAKE 1 TABLET(10 MG) BY MOUTH DAILY 02/17/19  Yes Lorenza Winkleman, Eilleen KempfMiguel Jose, MD  atorvastatin (LIPITOR) 20 MG tablet Take 1 tablet (20 mg total) by mouth daily. 05/16/18  Yes Wallis BambergMani, Mario, PA-C  azelastine (OPTIVAR) 0.05 % ophthalmic solution Place 1 drop into both eyes 2 (two) times daily.   Yes [provider]  benzonatate (TESSALON) 200 MG capsule Take 1 capsule (200 mg total) by mouth 2 (two) times daily as needed for cough. 12/26/18  Yes Derelle Cockrell, Eilleen KempfMiguel Jose, MD  lisinopril-hydrochlorothiazide (PRINZIDE,ZESTORETIC) 10-12.5 MG tablet TAKE 1 TABLET BY MOUTH DAILY 02/20/19  Yes Nafisah Runions, Eilleen KempfMiguel Jose, MD  montelukast (SINGULAIR) 10 MG tablet Take 10 mg by mouth at bedtime.   Yes [provider]  omeprazole (PRILOSEC) 20 MG capsule Take 1 capsule (20 mg total) by mouth daily. 01/10/19  Yes Rancour, Jeannett SeniorStephen, MD  ranitidine (ZANTAC) 150 MG tablet Take 1 tablet (150 mg total) by mouth 2 (two) times daily. Patient not taking: Reported on 03/22/2019 06/17/18   Wallis BambergMani, Mario, PA-C  sildenafil (VIAGRA) 50 MG tablet Take 1 tablet (50 mg total) by mouth as needed for erectile dysfunction. Needs office visit Patient not taking: Reported on 03/22/2019 01/09/15   Pick-Jacobs, Everardo AllJohn C, DO    No Known Allergies  Patient Active Problem List   Diagnosis Date Noted   Chronic allergic rhinitis 01/29/2018   Erectile dysfunction due to arterial insufficiency 01/09/2015   Elevated blood pressure  01/09/2015   HTN (hypertension) 07/29/2012    Past Medical History:  Diagnosis Date   High cholesterol    Hypertension     No past surgical history on file.  Social History   Socioeconomic History   Marital status: Single    Spouse name: Not on file   Number of children: Not on file   Years of education: Not on file   Highest education level: Not on file  Occupational History   Not on file  Social Needs   Financial resource strain: Not on file   Food insecurity:    Worry: Not on file    Inability: Not on file   Transportation needs:    Medical: Not on file    Non-medical: Not on file  Tobacco Use   Smoking status: Former Smoker    Last attempt to quit: 12/29/1998    Years since quitting: 20.2   Smokeless tobacco: Never Used  Substance and Sexual Activity   Alcohol use: No   Drug use: No   Sexual activity: Never    Birth control/protection: Abstinence  Lifestyle   Physical activity:    Days per week: Not on file    Minutes per session: Not on file   Stress: Not on file  Relationships   Social connections:    Talks on phone: Not on file    Gets together: Not on file    Attends religious service: Not on file    Active  member of club or organization: Not on file    Attends meetings of clubs or organizations: Not on file    Relationship status: Not on file   Intimate partner violence:    Fear of current or ex partner: Not on file    Emotionally abused: Not on file    Physically abused: Not on file    Forced sexual activity: Not on file  Other Topics Concern   Not on file  Social History Narrative   Not on file    No family history on file.   Review of Systems  Constitutional: Negative.  Negative for chills and fever.  HENT: Negative.  Negative for congestion and sore throat.   Respiratory: Negative.  Negative for cough and shortness of breath.   Cardiovascular: Negative.  Negative for chest pain and palpitations.    Gastrointestinal: Negative.  Negative for abdominal pain, diarrhea, nausea and vomiting.  Musculoskeletal: Negative for myalgias.  Skin: Negative.  Negative for rash.  Neurological: Negative for dizziness and headaches.  Endo/Heme/Allergies: Negative.   All other systems reviewed and are negative.   Vitals:   03/22/19 1106  BP: (!) 124/56  Pulse: (!) 128  Resp: 16  Temp: 98.2 F (36.8 C)  SpO2: 99%   Repeat heart rate at bedside: 90 and regular Physical Exam Vitals signs reviewed.  Constitutional:      Appearance: Normal appearance.  HENT:     Head: Normocephalic and atraumatic.  Eyes:     Extraocular Movements: Extraocular movements intact.     Pupils: Pupils are equal, round, and reactive to light.  Neck:     Musculoskeletal: Normal range of motion.  Cardiovascular:     Rate and Rhythm: Normal rate and regular rhythm.     Heart sounds: Normal heart sounds.  Pulmonary:     Effort: Pulmonary effort is normal.     Breath sounds: Normal breath sounds.  Abdominal:     Palpations: Abdomen is soft.     Tenderness: There is no abdominal tenderness.  Genitourinary:    Rectum: External hemorrhoid (Tender and swollen, no bleeding) present.  Musculoskeletal: Normal range of motion.  Skin:    General: Skin is warm and dry.     Capillary Refill: Capillary refill takes less than 2 seconds.  Neurological:     General: No focal deficit present.     Mental Status: He is alert and oriented to person, place, and time.  Psychiatric:        Mood and Affect: Mood normal.        Behavior: Behavior normal.    A total of 25 minutes was spent in the room with the patient, greater than 50% of which was in counseling/coordination of care regarding diagnosis and treatment of hemorrhoids, medications, constipation treatment, and need for follow-up if no better or worse next week.   ASSESSMENT & PLAN: Malyk was seen today for hemorrhoids and dizziness.  Diagnoses and all orders for  this visit:  Inflamed external hemorrhoid -     hydrocortisone (ANUSOL-HC) 2.5 % CREA; Place 1 application rectally 2 (two) times daily.  Essential hypertension    Patient Instructions       If you have lab work done today you will be contacted with your lab results within the next 2 weeks.  If you have not heard from [place] then please contact us. The fastest way to get your results is to register for My Chart.   IF you received an x-ray today,  you will receive an invoice from [facility]. Please contact [facility] at [phone] with questions or concerns regarding your invoice.   IF you received labwork today, you will receive an invoice from [facility]. Please contact [facility] at [phone] with questions or concerns regarding your invoice.   Our billing staff will not be able to assist you with questions regarding bills from these companies.  You will be contacted with the lab results as soon as they are available. The fastest way to get your results is to activate your My Chart account. Instructions are located on the last page of this paperwork. If you have not heard from [place] regarding the results in 2 weeks, please contact this office.     Hemorrhoids Hemorrhoids are swollen veins that may develop:  In the butt (rectum). These are called internal hemorrhoids.  Around the opening of the butt (anus). These are called external hemorrhoids. Hemorrhoids can cause pain, itching, or bleeding. Most of the time, they do not cause serious problems. They usually get better with diet changes, lifestyle changes, and other home treatments. What are the causes? This condition may be caused by:  Having trouble pooping (constipation).  Pushing hard (straining) to poop.  Watery poop (diarrhea).  Pregnancy.  Being very overweight (obese).  Sitting for long periods of time.  Heavy lifting or other activity that causes you to strain.  Anal sex.  Riding a bike  for a long period of time. What are the signs or symptoms? Symptoms of this condition include:  Pain.  Itching or soreness in the butt.  Bleeding from the butt.  Leaking poop.  Swelling in the area.  One or more lumps around the opening of your butt. How is this diagnosed? A doctor can often diagnose this condition by looking at the affected area. The doctor may also:  Do an exam that involves feeling the area with a gloved hand (digital rectal exam).  Examine the area inside your butt using a small tube (anoscope).  Order blood tests. This may be done if you have lost a lot of blood.  Have you get a test that involves looking inside the colon using a flexible tube with a camera on the end (sigmoidoscopy or colonoscopy). How is this treated? This condition can usually be treated at home. Your doctor may tell you to change what you eat, make lifestyle changes, or try home treatments. If these do not help, procedures can be done to remove the hemorrhoids or make them smaller. These may involve:  Placing rubber bands at the base of the hemorrhoids to cut off their blood supply.  Injecting medicine into the hemorrhoids to shrink them.  Shining a type of light energy onto the hemorrhoids to cause them to fall off.  Doing surgery to remove the hemorrhoids or cut off their blood supply. Follow these instructions at home: Eating and drinking   Eat foods that have a lot of fiber in them. These include whole grains, beans, nuts, fruits, and vegetables.  Ask your doctor about taking products that have added fiber (fibersupplements).  Reduce the amount of fat in your diet. You can do this by: ? Eating low-fat dairy products. ? Eating less red meat. ? Avoiding processed foods.  Drink enough fluid to keep your pee (urine) pale yellow. Managing pain and swelling   Take a warm-water bath (sitz bath) for 20 minutes to ease pain. Do this 3-4 times a day. You may do this in a bathtub  or using a portable sitz bath that fits over the toilet.  If told, put ice on the painful area. It may be helpful to use ice between your warm baths. ? Put ice in a plastic bag. ? Place a towel between your skin and the bag. ? Leave the ice on for 20 minutes, 2-3 times a day. General instructions  Take over-the-counter and prescription medicines only as told by your doctor. ? Medicated creams and medicines may be used as told.  Exercise often. Ask your doctor how much and what kind of exercise is best for you.  Go to the bathroom when you have the urge to poop. Do not wait.  Avoid pushing too hard when you poop.  Keep your butt dry and clean. Use wet toilet paper or moist towelettes after pooping.  Do not sit on the toilet for a long time.  Keep all follow-up visits as told by your doctor. This is important. Contact a doctor if you:  Have pain and swelling that do not get better with treatment or medicine.  Have trouble pooping.  Cannot poop.  Have pain or swelling outside the area of the hemorrhoids. Get help right away if you have:  Bleeding that will not stop. Summary  Hemorrhoids are swollen veins in the butt or around the opening of the butt.  They can cause pain, itching, or bleeding.  Eat foods that have a lot of fiber in them. These include whole grains, beans, nuts, fruits, and vegetables.  Take a warm-water bath (sitz bath) for 20 minutes to ease pain. Do this 3-4 times a day. This information is not intended to replace advice given to you by your health care provider. Make sure you discuss any questions you have with your health care provider. Document Released: 09/08/2008 Document Revised: 04/21/2018 Document Reviewed: 04/21/2018 Elsevier Interactive Patient Education  2019 Elsevier Inc.      Edwina Barth, MD [facility]

## 2019-03-22 NOTE — Patient Instructions (Addendum)
   If you have lab work done today you will be contacted with your lab results within the next 2 weeks.  If you have not heard from us then please contact us. The fastest way to get your results is to register for My Chart.   IF you received an x-ray today, you will receive an invoice from [facility]. Please contact [facility] at [phone] with questions or concerns regarding your invoice.   IF you received labwork today, you will receive an invoice from [facility]. Please contact [facility] at [phone] with questions or concerns regarding your invoice.   Our billing staff will not be able to assist you with questions regarding bills from these companies.  You will be contacted with the lab results as soon as they are available. The fastest way to get your results is to activate your My Chart account. Instructions are located on the last page of this paperwork. If you have not heard from us regarding the results in 2 weeks, please contact this office.     Hemorrhoids Hemorrhoids are swollen veins that may develop:  In the butt (rectum). These are called internal hemorrhoids.  Around the opening of the butt (anus). These are called external hemorrhoids. Hemorrhoids can cause pain, itching, or bleeding. Most of the time, they do not cause serious problems. They usually get better with diet changes, lifestyle changes, and other home treatments. What are the causes? This condition may be caused by:  Having trouble pooping (constipation).  Pushing hard (straining) to poop.  Watery poop (diarrhea).  Pregnancy.  Being very overweight (obese).  Sitting for long periods of time.  Heavy lifting or other activity that causes you to strain.  Anal sex.  Riding a bike for a long period of time. What are the signs or symptoms? Symptoms of this condition include:  Pain.  Itching or soreness in the butt.  Bleeding from the butt.  Leaking poop.  Swelling  in the area.  One or more lumps around the opening of your butt. How is this diagnosed? A doctor can often diagnose this condition by looking at the affected area. The doctor may also:  Do an exam that involves feeling the area with a gloved hand (digital rectal exam).  Examine the area inside your butt using a small tube (anoscope).  Order blood tests. This may be done if you have lost a lot of blood.  Have you get a test that involves looking inside the colon using a flexible tube with a camera on the end (sigmoidoscopy or colonoscopy). How is this treated? This condition can usually be treated at home. Your doctor may tell you to change what you eat, make lifestyle changes, or try home treatments. If these do not help, procedures can be done to remove the hemorrhoids or make them smaller. These may involve:  Placing rubber bands at the base of the hemorrhoids to cut off their blood supply.  Injecting medicine into the hemorrhoids to shrink them.  Shining a type of light energy onto the hemorrhoids to cause them to fall off.  Doing surgery to remove the hemorrhoids or cut off their blood supply. Follow these instructions at home: Eating and drinking   Eat foods that have a lot of fiber in them. These include whole grains, beans, nuts, fruits, and vegetables.  Ask your doctor about taking products that have added fiber (fibersupplements).  Reduce the amount of fat in your diet. You can do this by: ?   Eating low-fat dairy products. ? Eating less red meat. ? Avoiding processed foods.  Drink enough fluid to keep your pee (urine) pale yellow. Managing pain and swelling   Take a warm-water bath (sitz bath) for 20 minutes to ease pain. Do this 3-4 times a day. You may do this in a bathtub or using a portable sitz bath that fits over the toilet.  If told, put ice on the painful area. It may be helpful to use ice between your warm baths. ? Put ice in a plastic bag. ? Place a towel  between your skin and the bag. ? Leave the ice on for 20 minutes, 2-3 times a day. General instructions  Take over-the-counter and prescription medicines only as told by your doctor. ? Medicated creams and medicines may be used as told.  Exercise often. Ask your doctor how much and what kind of exercise is best for you.  Go to the bathroom when you have the urge to poop. Do not wait.  Avoid pushing too hard when you poop.  Keep your butt dry and clean. Use wet toilet paper or moist towelettes after pooping.  Do not sit on the toilet for a long time.  Keep all follow-up visits as told by your doctor. This is important. Contact a doctor if you:  Have pain and swelling that do not get better with treatment or medicine.  Have trouble pooping.  Cannot poop.  Have pain or swelling outside the area of the hemorrhoids. Get help right away if you have:  Bleeding that will not stop. Summary  Hemorrhoids are swollen veins in the butt or around the opening of the butt.  They can cause pain, itching, or bleeding.  Eat foods that have a lot of fiber in them. These include whole grains, beans, nuts, fruits, and vegetables.  Take a warm-water bath (sitz bath) for 20 minutes to ease pain. Do this 3-4 times a day. This information is not intended to replace advice given to you by your health care provider. Make sure you discuss any questions you have with your health care provider. Document Released: 09/08/2008 Document Revised: 04/21/2018 Document Reviewed: 04/21/2018 Elsevier Interactive Patient Education  2019 Elsevier Inc.  

## 2019-03-30 DIAGNOSIS — J301 Allergic rhinitis due to pollen: Secondary | ICD-10-CM | POA: Diagnosis not present

## 2019-03-30 DIAGNOSIS — J3081 Allergic rhinitis due to animal (cat) (dog) hair and dander: Secondary | ICD-10-CM | POA: Diagnosis not present

## 2019-03-30 DIAGNOSIS — J3089 Other allergic rhinitis: Secondary | ICD-10-CM | POA: Diagnosis not present

## 2019-04-06 DIAGNOSIS — J301 Allergic rhinitis due to pollen: Secondary | ICD-10-CM | POA: Diagnosis not present

## 2019-04-06 DIAGNOSIS — J3081 Allergic rhinitis due to animal (cat) (dog) hair and dander: Secondary | ICD-10-CM | POA: Diagnosis not present

## 2019-04-06 DIAGNOSIS — J3089 Other allergic rhinitis: Secondary | ICD-10-CM | POA: Diagnosis not present

## 2019-04-13 DIAGNOSIS — J3089 Other allergic rhinitis: Secondary | ICD-10-CM | POA: Diagnosis not present

## 2019-04-13 DIAGNOSIS — J3081 Allergic rhinitis due to animal (cat) (dog) hair and dander: Secondary | ICD-10-CM | POA: Diagnosis not present

## 2019-04-13 DIAGNOSIS — J301 Allergic rhinitis due to pollen: Secondary | ICD-10-CM | POA: Diagnosis not present

## 2019-04-17 ENCOUNTER — Other Ambulatory Visit: Payer: Self-pay | Admitting: Emergency Medicine

## 2019-04-17 DIAGNOSIS — K644 Residual hemorrhoidal skin tags: Secondary | ICD-10-CM

## 2019-04-17 NOTE — Telephone Encounter (Signed)
Requested medication (s) are due for refill today: yes  Requested medication (s) are on the active medication list: yes  Last refill:  03/22/2019  Future visit scheduled: no  Notes to clinic:  No protocol for medication  Requested Prescriptions  Pending Prescriptions Disp Refills   PROCTOZONE-HC 2.5 % rectal cream [Pharmacy Med Name: PROCTOZONE-HC 2.5% CREAM] 30 g     Sig: APPLY RECTALLY TO THE AFFECTED AREA TWICE DAILY     Off-Protocol Failed - 04/17/2019  6:56 AM      Failed - Medication not assigned to a protocol, review manually.      Passed - Valid encounter within last 12 months    Recent Outpatient Visits          3 weeks ago Inflamed external hemorrhoid   [facility] at [facility], Eilleen Kempf, MD   3 months ago Cough   [facility] at [facility], Eilleen Kempf, MD   10 months ago Essential hypertension   Primary Care at [facility], [place], [place]   11 months ago Essential hypertension   Primary Care at [facility], [place], [place]   1 year ago Essential hypertension   Primary Care at [place], [place], [place]

## 2019-04-19 DIAGNOSIS — J3081 Allergic rhinitis due to animal (cat) (dog) hair and dander: Secondary | ICD-10-CM | POA: Diagnosis not present

## 2019-04-19 DIAGNOSIS — J3089 Other allergic rhinitis: Secondary | ICD-10-CM | POA: Diagnosis not present

## 2019-04-19 DIAGNOSIS — J301 Allergic rhinitis due to pollen: Secondary | ICD-10-CM | POA: Diagnosis not present

## 2019-06-19 ENCOUNTER — Encounter: Payer: Self-pay | Admitting: Emergency Medicine

## 2019-06-20 ENCOUNTER — Other Ambulatory Visit: Payer: Self-pay

## 2019-06-20 ENCOUNTER — Encounter: Payer: Self-pay | Admitting: Emergency Medicine

## 2019-06-20 ENCOUNTER — Ambulatory Visit: Payer: 59 | Admitting: Emergency Medicine

## 2019-06-20 VITALS — BP 122/83 | HR 103 | Temp 98.2°F | Resp 16 | Ht 67.0 in | Wt 207.2 lb

## 2019-06-20 DIAGNOSIS — M109 Gout, unspecified: Secondary | ICD-10-CM

## 2019-06-20 DIAGNOSIS — M79671 Pain in right foot: Secondary | ICD-10-CM

## 2019-06-20 MED ORDER — DICLOFENAC SODIUM 75 MG PO TBEC: 75.0000 mg | DELAYED_RELEASE_TABLET | Freq: Two times a day (BID) | ORAL | 0 refills | Status: AC

## 2019-06-20 MED ORDER — TRAMADOL HCL 50 MG PO TABS: 50.0000 mg | ORAL_TABLET | Freq: Three times a day (TID) | ORAL | 0 refills | Status: AC | PRN

## 2019-06-20 NOTE — Patient Instructions (Addendum)
   If you have lab work done today you will be contacted with your lab results within the next 2 weeks.  If you have not heard from us then please contact us. The fastest way to get your results is to register for My Chart.   IF you received an x-ray today, you will receive an invoice from [facility]. Please contact [facility] at [phone] with questions or concerns regarding your invoice.   IF you received labwork today, you will receive an invoice from [facility]. Please contact [facility] at [phone] with questions or concerns regarding your invoice.   Our billing staff will not be able to assist you with questions regarding bills from these companies.  You will be contacted with the lab results as soon as they are available. The fastest way to get your results is to activate your My Chart account. Instructions are located on the last page of this paperwork. If you have not heard from us regarding the results in 2 weeks, please contact this office.     Gout  Gout is painful swelling of your joints. Gout is a type of arthritis. It is caused by having too much uric acid in your body. Uric acid is a chemical that is made when your body breaks down substances called purines. If your body has too much uric acid, sharp crystals can form and build up in your joints. This causes pain and swelling. Gout attacks can happen quickly and be very painful (acute gout). Over time, the attacks can affect more joints and happen more often (chronic gout). What are the causes?  Too much uric acid in your blood. This can happen because: ? Your kidneys do not remove enough uric acid from your blood. ? Your body makes too much uric acid. ? You eat too many foods that are high in purines. These foods include organ meats, some seafood, and beer.  Trauma or stress. What increases the risk?  Having a family history of gout.  Being male and middle-aged.  Being male and having gone  through menopause.  Being very overweight (obese).  Drinking alcohol, especially beer.  Not having enough water in the body (being dehydrated).  Losing weight too quickly.  Having an organ transplant.  Having lead poisoning.  Taking certain medicines.  Having kidney disease.  Having a skin condition called psoriasis. What are the signs or symptoms? An attack of acute gout usually happens in just one joint. The most common place is the big toe. Attacks often start at night. Other joints that may be affected include joints of the feet, ankle, knee, fingers, wrist, or elbow. Symptoms of an attack may include:  Very bad pain.  Warmth.  Swelling.  Stiffness.  Shiny, red, or purple skin.  Tenderness. The affected joint may be very painful to touch.  Chills and fever. Chronic gout may cause symptoms more often. More joints may be involved. You may also have white or yellow lumps (tophi) on your hands or feet or in other areas near your joints. How is this treated?  Treatment for this condition has two phases: treating an acute attack and preventing future attacks.  Acute gout treatment may include: ? NSAIDs. ? Steroids. These are taken by mouth or injected into a joint. ? Colchicine. This medicine relieves pain and swelling. It can be given by mouth or through an IV tube.  Preventive treatment may include: ? Taking small doses of NSAIDs or colchicine daily. ? Using a   medicine that reduces uric acid levels in your blood. ? Making changes to your diet. You may need to see a food expert (dietitian) about what to eat and drink to prevent gout. Follow these instructions at home: During a gout attack   If told, put ice on the painful area: ? Put ice in a plastic bag. ? Place a towel between your skin and the bag. ? Leave the ice on for 20 minutes, 2-3 times a day.  Raise (elevate) the painful joint above the level of your heart as often as you can.  Rest the joint as  much as possible. If the joint is in your leg, you may be given crutches.  Follow instructions from your doctor about what you cannot eat or drink. Avoiding future gout attacks  Eat a low-purine diet. Avoid foods and drinks such as: ? Liver. ? Kidney. ? Anchovies. ? Asparagus. ? Herring. ? Mushrooms. ? Mussels. ? Beer.  Stay at a healthy weight. If you want to lose weight, talk with your doctor. Do not lose weight too fast.  Start or continue an exercise plan as told by your doctor. Eating and drinking  Drink enough fluids to keep your pee (urine) pale yellow.  If you drink alcohol: ? Limit how much you use to:  0-1 drink a day for women.  0-2 drinks a day for men. ? Be aware of how much alcohol is in your drink. In the [place] one drink equals one 12 oz bottle of beer (355 mL), one 5 oz glass of wine (148 mL), or one 1 oz glass of hard liquor (44 mL). General instructions  Take over-the-counter and prescription medicines only as told by your doctor.  Do not drive or use heavy machinery while taking prescription pain medicine.  Return to your normal activities as told by your doctor. Ask your doctor what activities are safe for you.  Keep all follow-up visits as told by your doctor. This is important. Contact a doctor if:  You have another gout attack.  You still have symptoms of a gout attack after 10 days of treatment.  You have problems (side effects) because of your medicines.  You have chills or a fever.  You have burning pain when you pee (urinate).  You have pain in your lower back or belly. Get help right away if:  You have very bad pain.  Your pain cannot be controlled.  You cannot pee. Summary  Gout is painful swelling of the joints.  The most common site of pain is the big toe, but it can affect other joints.  Medicines and avoiding some foods can help to prevent and treat gout attacks. This information is not intended to replace advice given  to you by your health care provider. Make sure you discuss any questions you have with your health care provider. Document Released: 09/08/2008 Document Revised: 06/22/2018 Document Reviewed: 06/22/2018 Elsevier Patient Education  2020 Elsevier Inc.  

## 2019-06-20 NOTE — Progress Notes (Signed)
BP Readings from Last 3 Encounters:  06/20/19 122/83  03/22/19 (!) 124/56  01/10/19 114/80   Jesse Horton 58 y.o.   Chief Complaint  Patient presents with  . Foot Pain    per patient since last Thursday- RIGHT foot    HISTORY OF PRESENT ILLNESS: This is a 58 y.o. adult complaining of atraumatic pain to right foot that started 5 days ago.  Pain is sharp and constant and localized to big toe area.  No other significant symptoms.  No history of gout but he has had similar episodes in the past.  HPI   Prior to Admission medications   Medication Sig Start Date End Date Taking? Authorizing Provider  amLODipine (NORVASC) 10 MG tablet TAKE 1 TABLET(10 MG) BY MOUTH DAILY 02/17/19  Yes Talene Glastetter, Eilleen KempfMiguel Jose, MD  atorvastatin (LIPITOR) 20 MG tablet Take 1 tablet (20 mg total) by mouth daily. 05/16/18  Yes Wallis BambergMani, Mario, PA-C  azelastine (OPTIVAR) 0.05 % ophthalmic solution Place 1 drop into both eyes 2 (two) times daily.   Yes [provider]  lisinopril-hydrochlorothiazide (PRINZIDE,ZESTORETIC) 10-12.5 MG tablet TAKE 1 TABLET BY MOUTH DAILY 02/20/19  Yes Staphanie Harbison, Eilleen KempfMiguel Jose, MD  montelukast (SINGULAIR) 10 MG tablet Take 10 mg by mouth at bedtime.   Yes [provider]  omeprazole (PRILOSEC) 20 MG capsule Take 1 capsule (20 mg total) by mouth daily. 01/10/19  Yes Rancour, Jeannett SeniorStephen, MD  PROCTOZONE-HC 2.5 % rectal cream APPLY RECTALLY TO THE AFFECTED AREA TWICE DAILY 04/17/19  Yes Siobahn Worsley, Eilleen KempfMiguel Jose, MD  ranitidine (ZANTAC) 150 MG tablet Take 1 tablet (150 mg total) by mouth 2 (two) times daily. 06/17/18  Yes Wallis BambergMani, Mario, PA-C  sildenafil (VIAGRA) 50 MG tablet Take 1 tablet (50 mg total) by mouth as needed for erectile dysfunction. Needs office visit 01/09/15  Yes Pick-Jacobs, Everardo AllJohn C, DO    Not on File  Patient Active Problem List   Diagnosis Date Noted  . Chronic allergic rhinitis 01/29/2018  . Erectile dysfunction due to arterial insufficiency 01/09/2015  . Elevated blood  pressure 01/09/2015  . HTN (hypertension) 07/29/2012    Past Medical History:  Diagnosis Date  . High cholesterol   . Hypertension     History reviewed. No pertinent surgical history.  Social History   Socioeconomic History  . Marital status: Single    Spouse name: Not on file  . Number of children: Not on file  . Years of education: Not on file  . Highest education level: Not on file  Occupational History  . Not on file  Social Needs  . Financial resource strain: Not on file  . Food insecurity    Worry: Not on file    Inability: Not on file  . Transportation needs    Medical: Not on file    Non-medical: Not on file  Tobacco Use  . Smoking status: Former Smoker    Quit date: 12/29/1998    Years since quitting: 20.4  . Smokeless tobacco: Never Used  Substance and Sexual Activity  . Alcohol use: No  . Drug use: No  . Sexual activity: Never    Birth control/protection: Abstinence  Lifestyle  . Physical activity    Days per week: Not on file    Minutes per session: Not on file  . Stress: Not on file  Relationships  . Social Musicianconnections    Talks on phone: Not on file    Gets together: Not on file    Attends religious service: Not on  file    Active member of club or organization: Not on file    Attends meetings of clubs or organizations: Not on file    Relationship status: Not on file  . Intimate partner violence    Fear of current or ex partner: Not on file    Emotionally abused: Not on file    Physically abused: Not on file    Forced sexual activity: Not on file  Other Topics Concern  . Not on file  Social History Narrative  . Not on file    History reviewed. No pertinent family history.   Review of Systems  Constitutional: Negative.  Negative for chills and fever.  HENT: Negative.   Eyes: Negative.   Respiratory: Negative.  Negative for cough and shortness of breath.   Cardiovascular: Negative.  Negative for chest pain and palpitations.   Gastrointestinal: Negative.  Negative for abdominal pain, diarrhea, nausea and vomiting.  Genitourinary: Negative.   Musculoskeletal: Negative.  Negative for myalgias and neck pain.  Skin: Negative.  Negative for rash.  Neurological: Negative.  Negative for dizziness and headaches.  Endo/Heme/Allergies: Negative.   All other systems reviewed and are negative.    Vitals:   06/20/19 1606  BP: 122/83  Pulse: (!) 103  Resp: 16  Temp: 98.2 F (36.8 C)  SpO2: 98%     Physical Exam Vitals signs reviewed.  Constitutional:      Appearance: Normal appearance.  HENT:     Head: Normocephalic and atraumatic.  Eyes:     Extraocular Movements: Extraocular movements intact.     Pupils: Pupils are equal, round, and reactive to light.  Neck:     Musculoskeletal: Normal range of motion and neck supple.  Cardiovascular:     Rate and Rhythm: Normal rate and regular rhythm.     Pulses: Normal pulses.     Heart sounds: Normal heart sounds.  Pulmonary:     Effort: Pulmonary effort is normal.     Breath sounds: Normal breath sounds.  Musculoskeletal: Normal range of motion.     Comments: Right foot: Tenderness with mild swelling and erythema to first MTP joint.  Painful range of motion.  Skin:    General: Skin is warm and dry.     Capillary Refill: Capillary refill takes less than 2 seconds.  Neurological:     General: No focal deficit present.     Mental Status: He is alert and oriented to person, place, and time.  Psychiatric:        Mood and Affect: Mood normal.        Behavior: Behavior normal.   A total of 25 minutes was spent in the room with the patient, greater than 50% of which was in counseling/coordination of care regarding differential diagnosis including but not limited to gout, treatment, medication side effects, diet and prevention, physical activity restrictions, prognosis, and need for follow-up if no better or worse.    ASSESSMENT & PLAN: [name] was seen today for  foot pain.  Diagnoses and all orders for this visit:  Acute gouty arthritis -     diclofenac (VOLTAREN) 75 MG EC tablet; Take 1 tablet (75 mg total) by mouth 2 (two) times daily for 5 days. After 5 days take as needed.  Acute foot pain, right -     traMADol (ULTRAM) 50 MG tablet; Take 1 tablet (50 mg total) by mouth every 8 (eight) hours as needed for up to 5 days.    Patient Instructions  If you have lab work done today you will be contacted with your lab results within the next 2 weeks.  If you have not heard from [place] then please contact us. The fastest way to get your results is to register for My Chart.   IF you received an x-ray today, you will receive an invoice from [facility]. Please contact [facility] at [phone] with questions or concerns regarding your invoice.   IF you received labwork today, you will receive an invoice from [facility]. Please contact [facility] at [phone] with questions or concerns regarding your invoice.   Our billing staff will not be able to assist you with questions regarding bills from these companies.  You will be contacted with the lab results as soon as they are available. The fastest way to get your results is to activate your My Chart account. Instructions are located on the last page of this paperwork. If you have not heard from [place] regarding the results in 2 weeks, please contact this office.     Gout  Gout is painful swelling of your joints. Gout is a type of arthritis. It is caused by having too much uric acid in your body. Uric acid is a chemical that is made when your body breaks down substances called purines. If your body has too much uric acid, sharp crystals can form and build up in your joints. This causes pain and swelling. Gout attacks can happen quickly and be very painful (acute gout). Over time, the attacks can affect more joints and happen more often (chronic gout). What are the causes?  Too much  uric acid in your blood. This can happen because: ? Your kidneys do not remove enough uric acid from your blood. ? Your body makes too much uric acid. ? You eat too many foods that are high in purines. These foods include organ meats, some seafood, and beer.  Trauma or stress. What increases the risk?  Having a family history of gout.  Being male and middle-aged.  Being male and having gone through menopause.  Being very overweight (obese).  Drinking alcohol, especially beer.  Not having enough water in the body (being dehydrated).  Losing weight too quickly.  Having an organ transplant.  Having lead poisoning.  Taking certain medicines.  Having kidney disease.  Having a skin condition called psoriasis. What are the signs or symptoms? An attack of acute gout usually happens in just one joint. The most common place is the big toe. Attacks often start at night. Other joints that may be affected include joints of the feet, ankle, knee, fingers, wrist, or elbow. Symptoms of an attack may include:  Very bad pain.  Warmth.  Swelling.  Stiffness.  Shiny, red, or purple skin.  Tenderness. The affected joint may be very painful to touch.  Chills and fever. Chronic gout may cause symptoms more often. More joints may be involved. You may also have white or yellow lumps (tophi) on your hands or feet or in other areas near your joints. How is this treated?  Treatment for this condition has two phases: treating an acute attack and preventing future attacks.  Acute gout treatment may include: ? NSAIDs. ? Steroids. These are taken by mouth or injected into a joint. ? Colchicine. This medicine relieves pain and swelling. It can be given by mouth or through an IV tube.  Preventive treatment may include: ? Taking small doses of NSAIDs or colchicine daily. ? Using a medicine that reduces  uric acid levels in your blood. ? Making changes to your diet. You may need to see a  food expert (dietitian) about what to eat and drink to prevent gout. Follow these instructions at home: During a gout attack   If told, put ice on the painful area: ? Put ice in a plastic bag. ? Place a towel between your skin and the bag. ? Leave the ice on for 20 minutes, 2-3 times a day.  Raise (elevate) the painful joint above the level of your heart as often as you can.  Rest the joint as much as possible. If the joint is in your leg, you may be given crutches.  Follow instructions from your doctor about what you cannot eat or drink. Avoiding future gout attacks  Eat a low-purine diet. Avoid foods and drinks such as: ? Liver. ? Kidney. ? Anchovies. ? Asparagus. ? Herring. ? Mushrooms. ? Mussels. ? Beer.  Stay at a healthy weight. If you want to lose weight, talk with your doctor. Do not lose weight too fast.  Start or continue an exercise plan as told by your doctor. Eating and drinking  Drink enough fluids to keep your pee (urine) pale yellow.  If you drink alcohol: ? Limit how much you use to:  0-1 drink a day for women.  0-2 drinks a day for men. ? Be aware of how much alcohol is in your drink. In the [place] one drink equals one 12 oz bottle of beer (355 mL), one 5 oz glass of wine (148 mL), or one 1 oz glass of hard liquor (44 mL). General instructions  Take over-the-counter and prescription medicines only as told by your doctor.  Do not drive or use heavy machinery while taking prescription pain medicine.  Return to your normal activities as told by your doctor. Ask your doctor what activities are safe for you.  Keep all follow-up visits as told by your doctor. This is important. Contact a doctor if:  You have another gout attack.  You still have symptoms of a gout attack after 10 days of treatment.  You have problems (side effects) because of your medicines.  You have chills or a fever.  You have burning pain when you pee (urinate).  You have  pain in your lower back or belly. Get help right away if:  You have very bad pain.  Your pain cannot be controlled.  You cannot pee. Summary  Gout is painful swelling of the joints.  The most common site of pain is the big toe, but it can affect other joints.  Medicines and avoiding some foods can help to prevent and treat gout attacks. This information is not intended to replace advice given to you by your health care provider. Make sure you discuss any questions you have with your health care provider. Document Released: 09/08/2008 Document Revised: 06/22/2018 Document Reviewed: 06/22/2018 Elsevier Patient Education  2020 Elsevier Inc.      Edwina BarthMiguel Ethylene Reznick, MD [facility]

## 2019-07-26 ENCOUNTER — Encounter: Payer: Self-pay | Admitting: Emergency Medicine

## 2019-07-28 ENCOUNTER — Other Ambulatory Visit: Payer: Self-pay

## 2019-07-28 ENCOUNTER — Ambulatory Visit: Payer: 59 | Admitting: Family Medicine

## 2019-07-28 ENCOUNTER — Encounter: Payer: Self-pay | Admitting: Family Medicine

## 2019-07-28 VITALS — BP 146/94 | HR 103 | Temp 98.6°F | Ht 67.0 in | Wt 213.0 lb

## 2019-07-28 DIAGNOSIS — R51 Headache: Secondary | ICD-10-CM

## 2019-07-28 DIAGNOSIS — J309 Allergic rhinitis, unspecified: Secondary | ICD-10-CM | POA: Diagnosis not present

## 2019-07-28 DIAGNOSIS — R519 Headache, unspecified: Secondary | ICD-10-CM

## 2019-07-28 DIAGNOSIS — I10 Essential (primary) hypertension: Secondary | ICD-10-CM

## 2019-07-28 MED ORDER — ATORVASTATIN CALCIUM 20 MG PO TABS: 20.0000 mg | ORAL_TABLET | Freq: Every day | ORAL | 3 refills | Status: DC

## 2019-07-28 MED ORDER — LISINOPRIL 20 MG PO TABS: 20.0000 mg | ORAL_TABLET | Freq: Every day | ORAL | 1 refills | Status: DC

## 2019-07-28 MED ORDER — AMLODIPINE BESYLATE 10 MG PO TABS: ORAL_TABLET | ORAL | 1 refills | Status: DC

## 2019-07-28 MED ORDER — MONTELUKAST SODIUM 10 MG PO TABS: 10.0000 mg | ORAL_TABLET | Freq: Every day | ORAL | 1 refills | Status: AC

## 2019-07-28 NOTE — Progress Notes (Signed)
8/14/202011:12 AM  Jesse Horton Mar 01, 1961, 58 y.o., adult [id number]  Chief Complaint  Patient presents with  . Headache    HPI:   Patient is a 58 y.o. adult with past medical history significant for HTN, HLP, gout who presents today for headache x 4 days  PCP Dr Mitchel Honour  Patient started having headaches for past 4 days Feels like a tight band around his head Thinks his headaches are from allergies Reports intermittent blurry vision, feels gritty Denies any runny nose, nasal congestion, sneezing Reports itchy and watery eyes and throat Has been using optivar which helps his itchy eyes Is taking zyrtec but is not taking his singulair Has not taken anything anything for his HA  Stopped taking HTN and HLP medications due to gout  He stopped taking meds about 3 weeks ago Denies any chest pain, SOB, edema, focal weakness, speech changes, nausea or vomting  BP Readings from Last 3 Encounters:  07/28/19 (!) 146/94  06/20/19 122/83  03/22/19 (!) 124/56   Lab Results  Component Value Date   CREATININE 1.20 01/09/2019   BUN 18 01/09/2019   NA 136 01/09/2019   K 3.9 01/09/2019   CL 102 01/09/2019   CO2 22 01/09/2019   Lab Results  Component Value Date   CHOL 175 05/13/2018   HDL 31 (L) 05/13/2018   LDLCALC 106 (H) 05/13/2018   TRIG 191 (H) 05/13/2018   CHOLHDL 5.6 (H) 05/13/2018    Depression screen PHQ 2/9 07/28/2019 06/20/2019 03/22/2019  Decreased Interest 0 0 0  Down, Depressed, Hopeless 0 0 0  PHQ - 2 Score 0 0 0    Fall Risk  07/28/2019 06/20/2019 03/22/2019 12/26/2018 05/13/2018  Falls in the past year? 0 0 0 0 No  Number falls in past yr: 0 - - - -  Injury with Fall? 0 - - - -  Follow up Falls evaluation completed Falls evaluation completed Falls evaluation completed - -     No Known Allergies  Prior to Admission medications   Medication Sig Start Date End Date Taking? Authorizing Provider  montelukast (SINGULAIR) 10 MG tablet Take 10 mg by mouth at  bedtime.   Yes [provider]  omeprazole (PRILOSEC) 20 MG capsule Take 1 capsule (20 mg total) by mouth daily. 01/10/19  Yes Rancour, Annie Main, MD  ranitidine (ZANTAC) 150 MG tablet Take 1 tablet (150 mg total) by mouth 2 (two) times daily. 06/17/18  Yes Jaynee Eagles, PA-C  sildenafil (VIAGRA) 50 MG tablet Take 1 tablet (50 mg total) by mouth as needed for erectile dysfunction. Needs office visit 01/09/15  Yes Pick-Jacobs, John C, DO  amLODipine (NORVASC) 10 MG tablet TAKE 1 TABLET(10 MG) BY MOUTH DAILY Patient not taking: Reported on 06/20/2019 02/17/19   Horald Pollen, MD  atorvastatin (LIPITOR) 20 MG tablet Take 1 tablet (20 mg total) by mouth daily. Patient not taking: Reported on 07/28/2019 05/16/18   Jaynee Eagles, PA-C  azelastine (OPTIVAR) 0.05 % ophthalmic solution Place 1 drop into both eyes 2 (two) times daily.    [provider]  lisinopril-hydrochlorothiazide (PRINZIDE,ZESTORETIC) 10-12.5 MG tablet TAKE 1 TABLET BY MOUTH DAILY Patient not taking: Reported on 07/28/2019 02/20/19   Horald Pollen, MD  PROCTOZONE-HC 2.5 % rectal cream APPLY RECTALLY TO THE AFFECTED AREA TWICE DAILY Patient not taking: Reported on 07/28/2019 04/17/19   Horald Pollen, MD    Past Medical History:  Diagnosis Date  . High cholesterol   . Hypertension  No past surgical history on file.  Social History   Tobacco Use  . Smoking status: Former Smoker    Quit date: 12/29/1998    Years since quitting: 20.5  . Smokeless tobacco: Never Used  Substance Use Topics  . Alcohol use: No    No family history on file.  ROS Per HPI  OBJECTIVE:  Today's Vitals   07/28/19 1101 07/28/19 1111  BP: (!) 156/106 (!) 146/94  Pulse: (!) 103   Temp: 98.6 F (37 C)   TempSrc: Oral   SpO2: 93%   Weight: 213 lb (96.6 kg)   Height: 5\' 7"  (1.702 m)    Body mass index is 33.36 kg/m.   Physical Exam Vitals signs and nursing note reviewed.  Constitutional:      Appearance: He is  well-developed.  HENT:     Head: Normocephalic and atraumatic.     Nose:     Right Sinus: No maxillary sinus tenderness or frontal sinus tenderness.     Left Sinus: No maxillary sinus tenderness or frontal sinus tenderness.  Eyes:     Conjunctiva/sclera: Conjunctivae normal.     Pupils: Pupils are equal, round, and reactive to light.  Neck:     Musculoskeletal: Neck supple.  Cardiovascular:     Rate and Rhythm: Normal rate and regular rhythm.     Heart sounds: No murmur. No friction rub. No gallop.   Pulmonary:     Effort: Pulmonary effort is normal.     Breath sounds: Normal breath sounds. No wheezing or rales.  Skin:    General: Skin is warm and dry.  Neurological:     General: No focal deficit present.     Mental Status: He is alert and oriented to person, place, and time.     ASSESSMENT and PLAN  1. Acute nonintractable headache, unspecified headache type Non focal neuro exam. In setting of d/c HTN meds. Restart meds. APAP prn, ER precautions given  2. Essential hypertension Dc hctz due to gout. Restart amlodipine and increase lisinopril to 20mg .  - amLODipine (NORVASC) 10 MG tablet; TAKE 1 TABLET(10 MG) BY MOUTH DAILY  3. Chronic allergic rhinitis Restart singulair.   Other orders - lisinopril (ZESTRIL) 20 MG tablet; Take 1 tablet (20 mg total) by mouth daily. - atorvastatin (LIPITOR) 20 MG tablet; Take 1 tablet (20 mg total) by mouth daily. - montelukast (SINGULAIR) 10 MG tablet; Take 1 tablet (10 mg total) by mouth at bedtime.  Return in about 4 weeks (around 08/25/2019) for with Dr Alvy BimlerSagardia for HTN.    Myles LippsIrma M Santiago, MD [facility] [address] [place], [place] [address] Ph.  [phone] Fax [phone]

## 2019-07-28 NOTE — Patient Instructions (Signed)
° ° ° °  If you have lab work done today you will be contacted with your lab results within the next 2 weeks.  If you have not heard from us then please contact us. The fastest way to get your results is to register for My Chart. ° ° °IF you received an x-ray today, you will receive an invoice from [facility]. Please contact [facility] at [phone] with questions or concerns regarding your invoice.  ° °IF you received labwork today, you will receive an invoice from [facility]. Please contact [facility] at [phone] with questions or concerns regarding your invoice.  ° °Our billing staff will not be able to assist you with questions regarding bills from these companies. ° °You will be contacted with the lab results as soon as they are available. The fastest way to get your results is to activate your My Chart account. Instructions are located on the last page of this paperwork. If you have not heard from us regarding the results in 2 weeks, please contact this office. °  ° ° ° °

## 2019-08-24 ENCOUNTER — Ambulatory Visit: Payer: 59 | Admitting: Emergency Medicine

## 2019-11-17 ENCOUNTER — Encounter: Payer: Self-pay | Admitting: Emergency Medicine

## 2020-01-17 IMAGING — DX DG CHEST 2V
2 series · 2 of 2 positions shown · non-contrast
Comparison: 04/19/2010

CLINICAL DATA: 58-year-old with a cough.

EXAM:
CHEST - 2 VIEW

[chest lat]
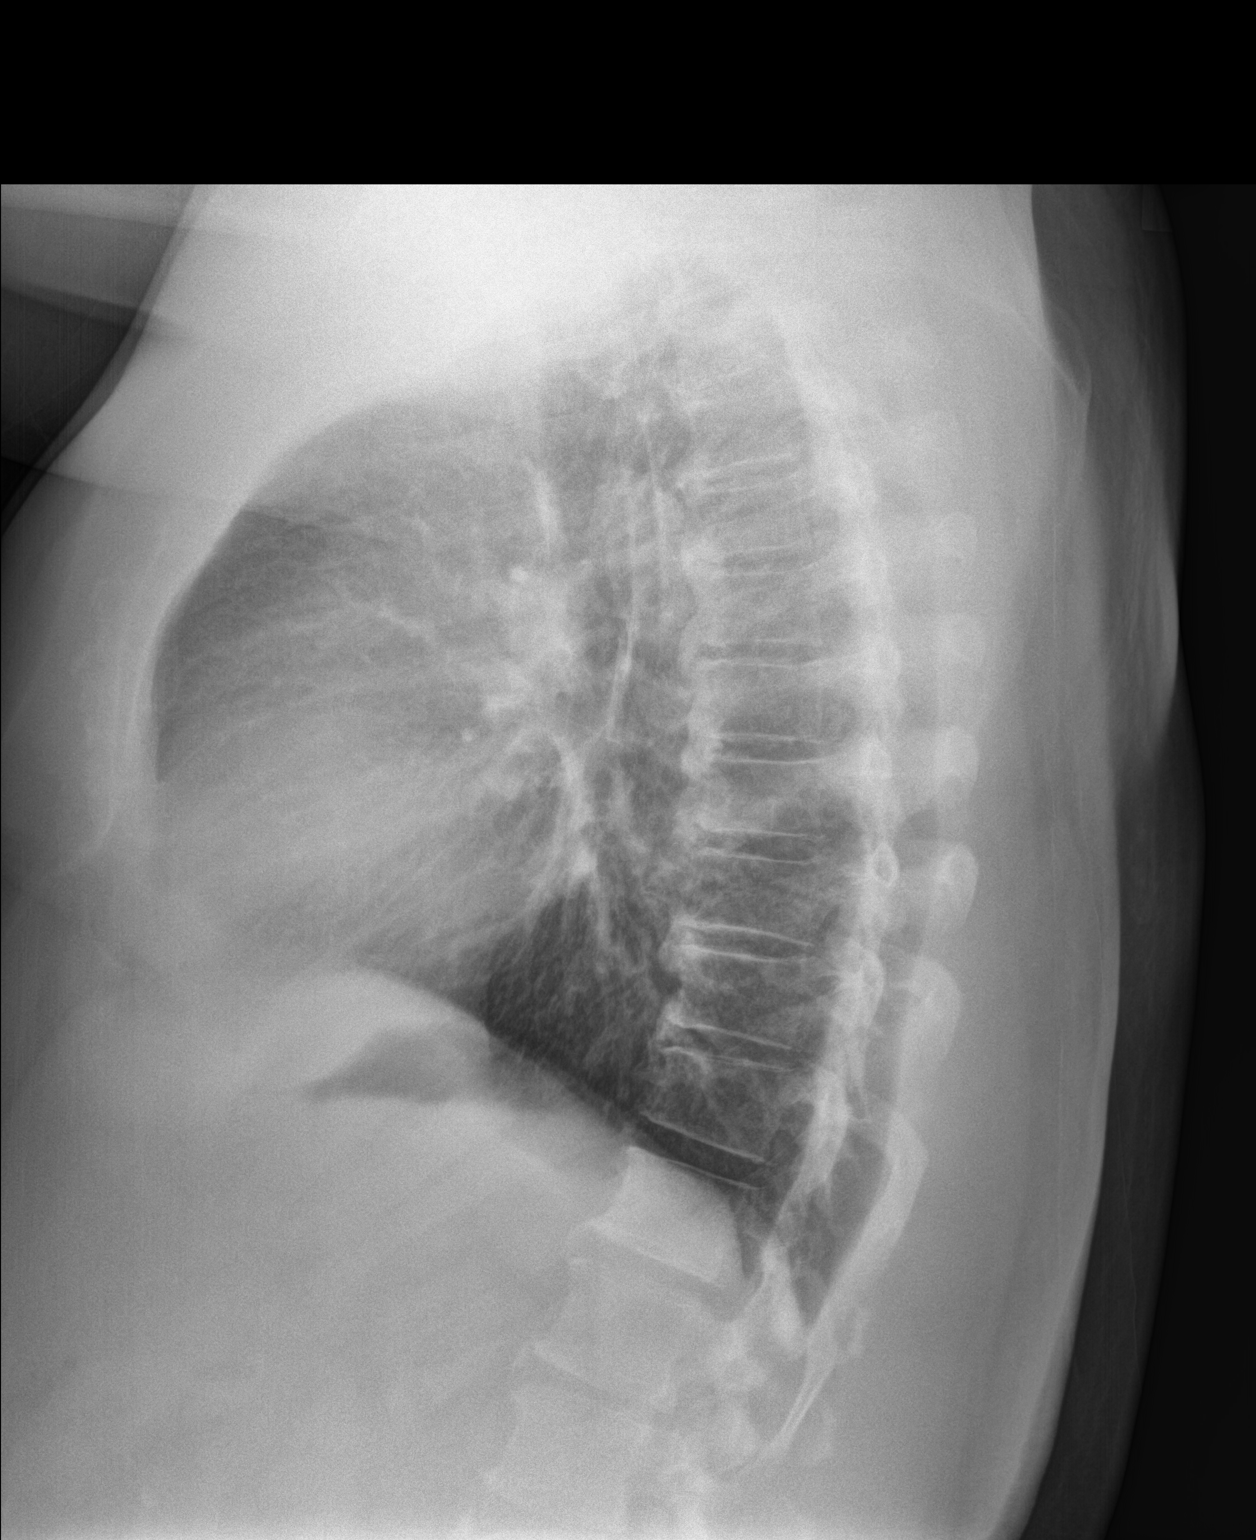

[chest pa]
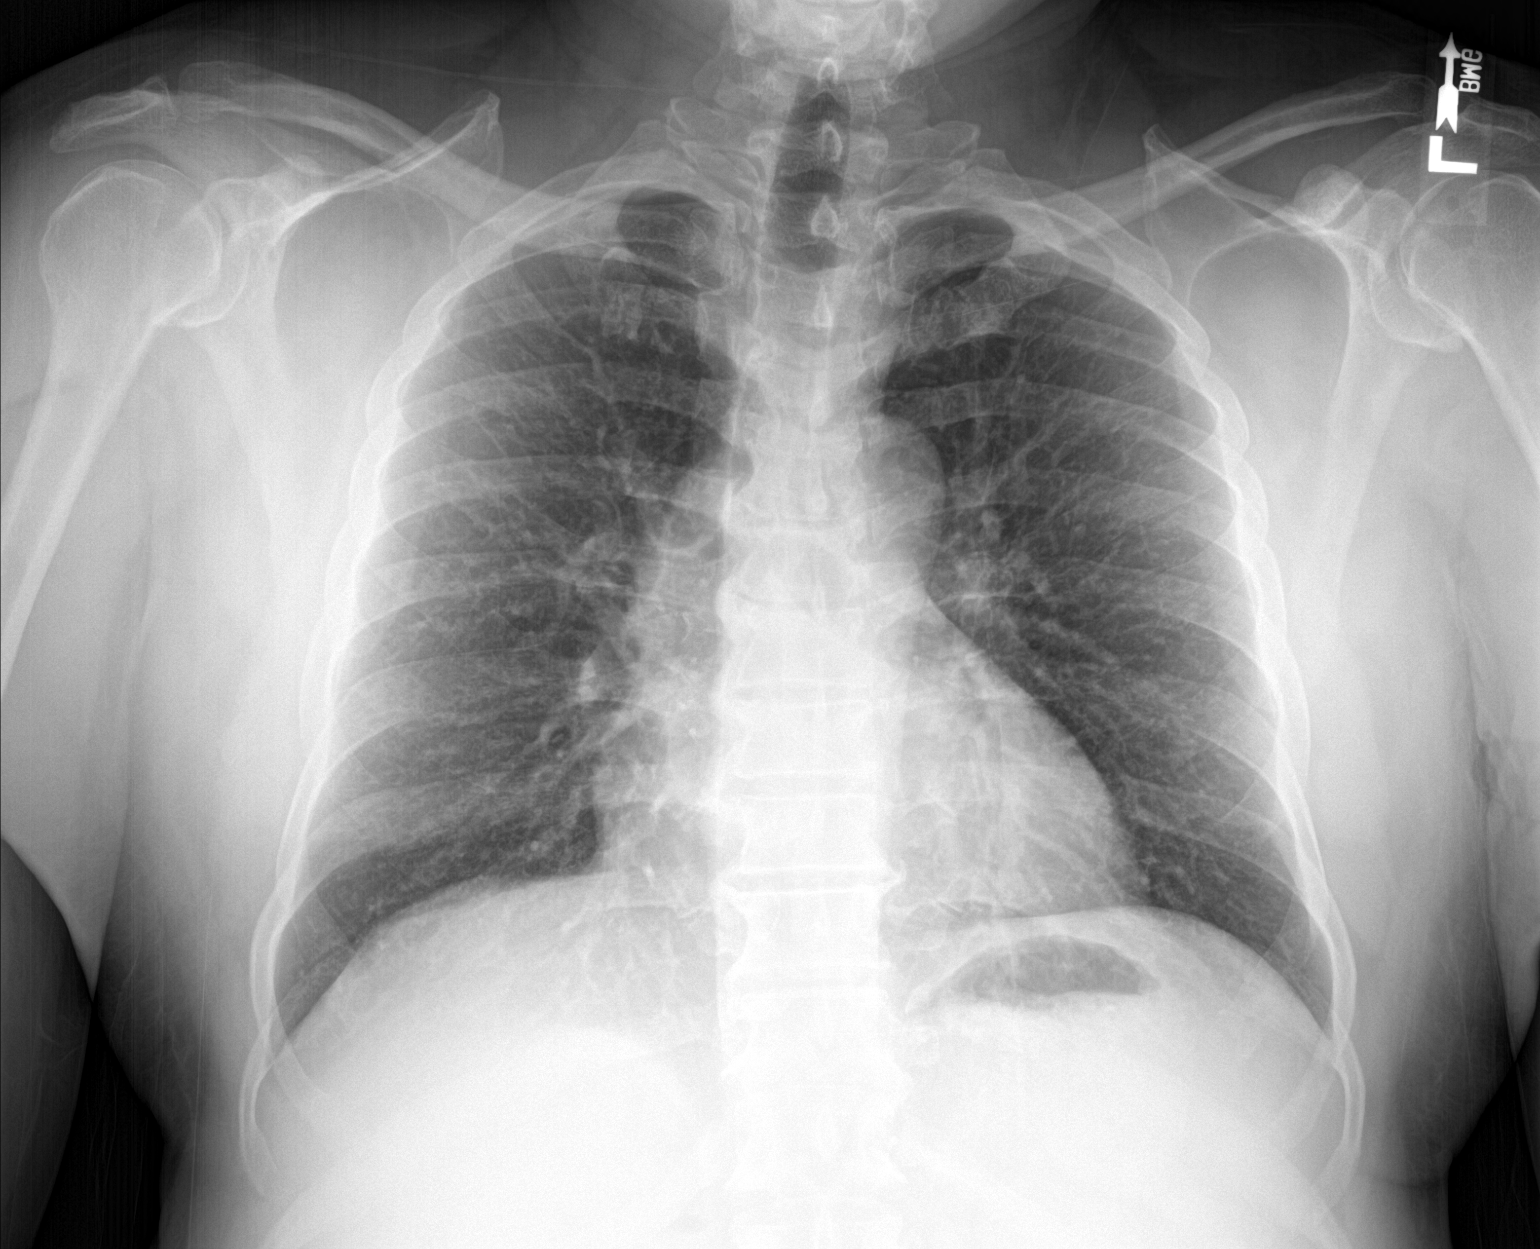

[2 of 2 positions shown; findings below may reference images not displayed]

FINDINGS: Coarse lung markings are suggestive for chronic changes. No focal
airspace disease or pulmonary edema. Heart and mediastinum are
within normal limits. No large pleural effusions. Bridging
osteophytes in the thoracic spine.
IMPRESSION: No active cardiopulmonary disease.

## 2020-01-22 ENCOUNTER — Other Ambulatory Visit: Payer: Self-pay | Admitting: Emergency Medicine

## 2020-01-22 MED ORDER — ATORVASTATIN CALCIUM 20 MG PO TABS: 20.0000 mg | ORAL_TABLET | Freq: Every day | ORAL | 0 refills | Status: DC

## 2020-01-26 ENCOUNTER — Other Ambulatory Visit: Payer: Self-pay

## 2020-01-26 ENCOUNTER — Telehealth (INDEPENDENT_AMBULATORY_CARE_PROVIDER_SITE_OTHER): Payer: 59 | Admitting: Adult Health Nurse Practitioner

## 2020-01-26 ENCOUNTER — Telehealth: Payer: Self-pay | Admitting: Adult Health Nurse Practitioner

## 2020-01-26 DIAGNOSIS — Z20822 Contact with and (suspected) exposure to covid-19: Secondary | ICD-10-CM

## 2020-01-26 NOTE — Progress Notes (Signed)
Telemedicine Encounter- SOAP NOTE Established Patient  This telephone encounter was conducted with the patient's (or proxy's) verbal consent via audio telecommunications: yes/no: Yes Patient was instructed to have this encounter in a suitably private space; and to only have persons present to whom they give permission to participate. In addition, patient identity was confirmed by use of name plus two identifiers (DOB and address).  I discussed the limitations, risks, security and privacy concerns of performing an evaluation and management service by telephone and the availability of in person appointments. I also discussed with the patient that there may be a patient responsible charge related to this service. The patient expressed understanding and agreed to proceed.  I spent a total of TIME; 0 MIN TO 60 MIN: 10 minutes talking with the patient or their proxy.  Chief Complaint  Patient presents with  . Migraine    coughing, fever which started on 01/23/2020. Pt was tested at [facility] on wed and the test was neg.    Subjective   Jesse Horton is a 59 y.o. established patient. Telephone visit today for suspected Covid   HPI Patient is calling for her symptoms of headaches and fevers, chills and body sweats.  At onset, Monday night.  Was sent home from work on Tuesday and received a Covid test on Wednesday which today was read as negative.  He is still feeling poorly with fevers and body aches.  No loss of taste or smell.  He has not taken anything for his symptoms.  Denies acute cough or shortness of breath.  Patient Active Problem List   Diagnosis Date Noted  . Encounter by telehealth for suspected COVID-19 01/26/2020  . Chronic allergic rhinitis 01/29/2018  . Erectile dysfunction due to arterial insufficiency 01/09/2015  . Elevated blood pressure 01/09/2015  . HTN (hypertension) 07/29/2012    Past Medical History:  Diagnosis Date  . High cholesterol   . Hypertension     Current  Outpatient Medications  Medication Sig Dispense Refill  . amLODipine (NORVASC) 10 MG tablet TAKE 1 TABLET(10 MG) BY MOUTH DAILY 90 tablet 1  . atorvastatin (LIPITOR) 20 MG tablet Take 1 tablet (20 mg total) by mouth daily. Needs OV for further refills. 90 tablet 0  . azelastine (OPTIVAR) 0.05 % ophthalmic solution Place 1 drop into both eyes 2 (two) times daily.    [name] lisinopril (ZESTRIL) 20 MG tablet Take 1 tablet (20 mg total) by mouth daily. 90 tablet 1  . montelukast (SINGULAIR) 10 MG tablet Take 1 tablet (10 mg total) by mouth at bedtime. 90 tablet 1  . omeprazole (PRILOSEC) 20 MG capsule Take 1 capsule (20 mg total) by mouth daily. 30 capsule 0  . PROCTOZONE-HC 2.5 % rectal cream APPLY RECTALLY TO THE AFFECTED AREA TWICE DAILY (Patient not taking: Reported on 07/28/2019) 30 g 0  . ranitidine (ZANTAC) 150 MG tablet Take 1 tablet (150 mg total) by mouth 2 (two) times daily. 60 tablet 5  . sildenafil (VIAGRA) 50 MG tablet Take 1 tablet (50 mg total) by mouth as needed for erectile dysfunction. Needs office visit 10 tablet 1   No current facility-administered medications for this visit.    No Known Allergies  Social History   Socioeconomic History  . Marital status: Single    Spouse name: Not on file  . Number of children: Not on file  . Years of education: Not on file  . Highest education level: Not on file  Occupational History  . Not  on file  Tobacco Use  . Smoking status: Former Smoker    Quit date: 12/29/1998    Years since quitting: 21.0  . Smokeless tobacco: Never Used  Substance and Sexual Activity  . Alcohol use: No  . Drug use: No  . Sexual activity: Never    Birth control/protection: Abstinence  Other Topics Concern  . Not on file  Social History Narrative  . Not on file   Social Determinants of Health   Financial Resource Strain:   . Difficulty of Paying Living Expenses: Not on file  Food Insecurity:   . Worried About Charity fundraiser in the Last Year: Not  on file  . Ran Out of Food in the Last Year: Not on file  Transportation Needs:   . Lack of Transportation (Medical): Not on file  . Lack of Transportation (Non-Medical): Not on file  Physical Activity:   . Days of Exercise per Week: Not on file  . Minutes of Exercise per Session: Not on file  Stress:   . Feeling of Stress : Not on file  Social Connections:   . Frequency of Communication with Friends and Family: Not on file  . Frequency of Social Gatherings with Friends and Family: Not on file  . Attends Religious Services: Not on file  . Active Member of Clubs or Organizations: Not on file  . Attends Archivist Meetings: Not on file  . Marital Status: Not on file  Intimate Partner Violence:   . Fear of Current or Ex-Partner: Not on file  . Emotionally Abused: Not on file  . Physically Abused: Not on file  . Sexually Abused: Not on file    Review of Systems  Constitutional: Positive for chills, diaphoresis, fever and malaise/fatigue.  HENT: Positive for congestion, sinus pain and sore throat. Negative for ear pain.   Eyes: Negative.   Respiratory: Positive for cough and sputum production. Negative for shortness of breath.   Cardiovascular: Negative for chest pain.  Musculoskeletal: Positive for myalgias.  Neurological: Positive for headaches. Negative for weakness.    Objective    GEN: WDWN, NAD, Non-toxic, Alert & Oriented x 3  PSYCH: Normally interactive. Conversant. Not depressed or anxious appearing.  Calm demeanor.    Vitals as reported by the patient: There were no vitals filed for this visit.  [name] was seen today for migraine.  Diagnoses and all orders for this visit:  Encounter by telehealth for suspected COVID-19 -     Novel Coronavirus, NAA ([facility])   Orders Placed This Encounter  Procedures  . Novel Coronavirus, NAA ([facility])    Discussed treatment with OTC cough and cold meds, particularly mucinex, tylenl, and motrin.  He is inline  with plan.  Will f/u as needed.   I discussed the assessment and treatment plan with the patient. The patient was provided an opportunity to ask questions and all were answered. The patient agreed with the plan and demonstrated an understanding of the instructions.   The patient was advised to call back or seek an in-person evaluation if the symptoms worsen or if the condition fails to improve as anticipated.  I provided 10 minutes of non-face-to-face time during this encounter.  Glyn Ade, NP  Primary Care at [facility]

## 2020-01-26 NOTE — Patient Instructions (Signed)
° ° ° °  If you have lab work done today you will be contacted with your lab results within the next 2 weeks.  If you have not heard from us then please contact us. The fastest way to get your results is to register for My Chart. ° ° °IF you received an x-ray today, you will receive an invoice from [facility]. Please contact [facility] at [phone] with questions or concerns regarding your invoice.  ° °IF you received labwork today, you will receive an invoice from [facility]. Please contact [facility] at [phone] with questions or concerns regarding your invoice.  ° °Our billing staff will not be able to assist you with questions regarding bills from these companies. ° °You will be contacted with the lab results as soon as they are available. The fastest way to get your results is to activate your My Chart account. Instructions are located on the last page of this paperwork. If you have not heard from us regarding the results in 2 weeks, please contact this office. °  ° ° ° °

## 2020-01-26 NOTE — Telephone Encounter (Signed)
C-

## 2020-01-29 ENCOUNTER — Ambulatory Visit: Payer: 59 | Attending: Internal Medicine

## 2020-01-29 DIAGNOSIS — Z20822 Contact with and (suspected) exposure to covid-19: Secondary | ICD-10-CM

## 2020-01-30 LAB — NOVEL CORONAVIRUS, NAA: SARS-CoV-2, NAA: NOT DETECTED

## 2020-01-31 IMAGING — CR DG CHEST 2V
2 series · 2 of 2 positions shown · non-contrast
Comparison: 12/26/2018

CLINICAL DATA: Chest and left arm pain for 2 months.  Hypertension.

EXAM:
CHEST - 2 VIEW

[chest pa]
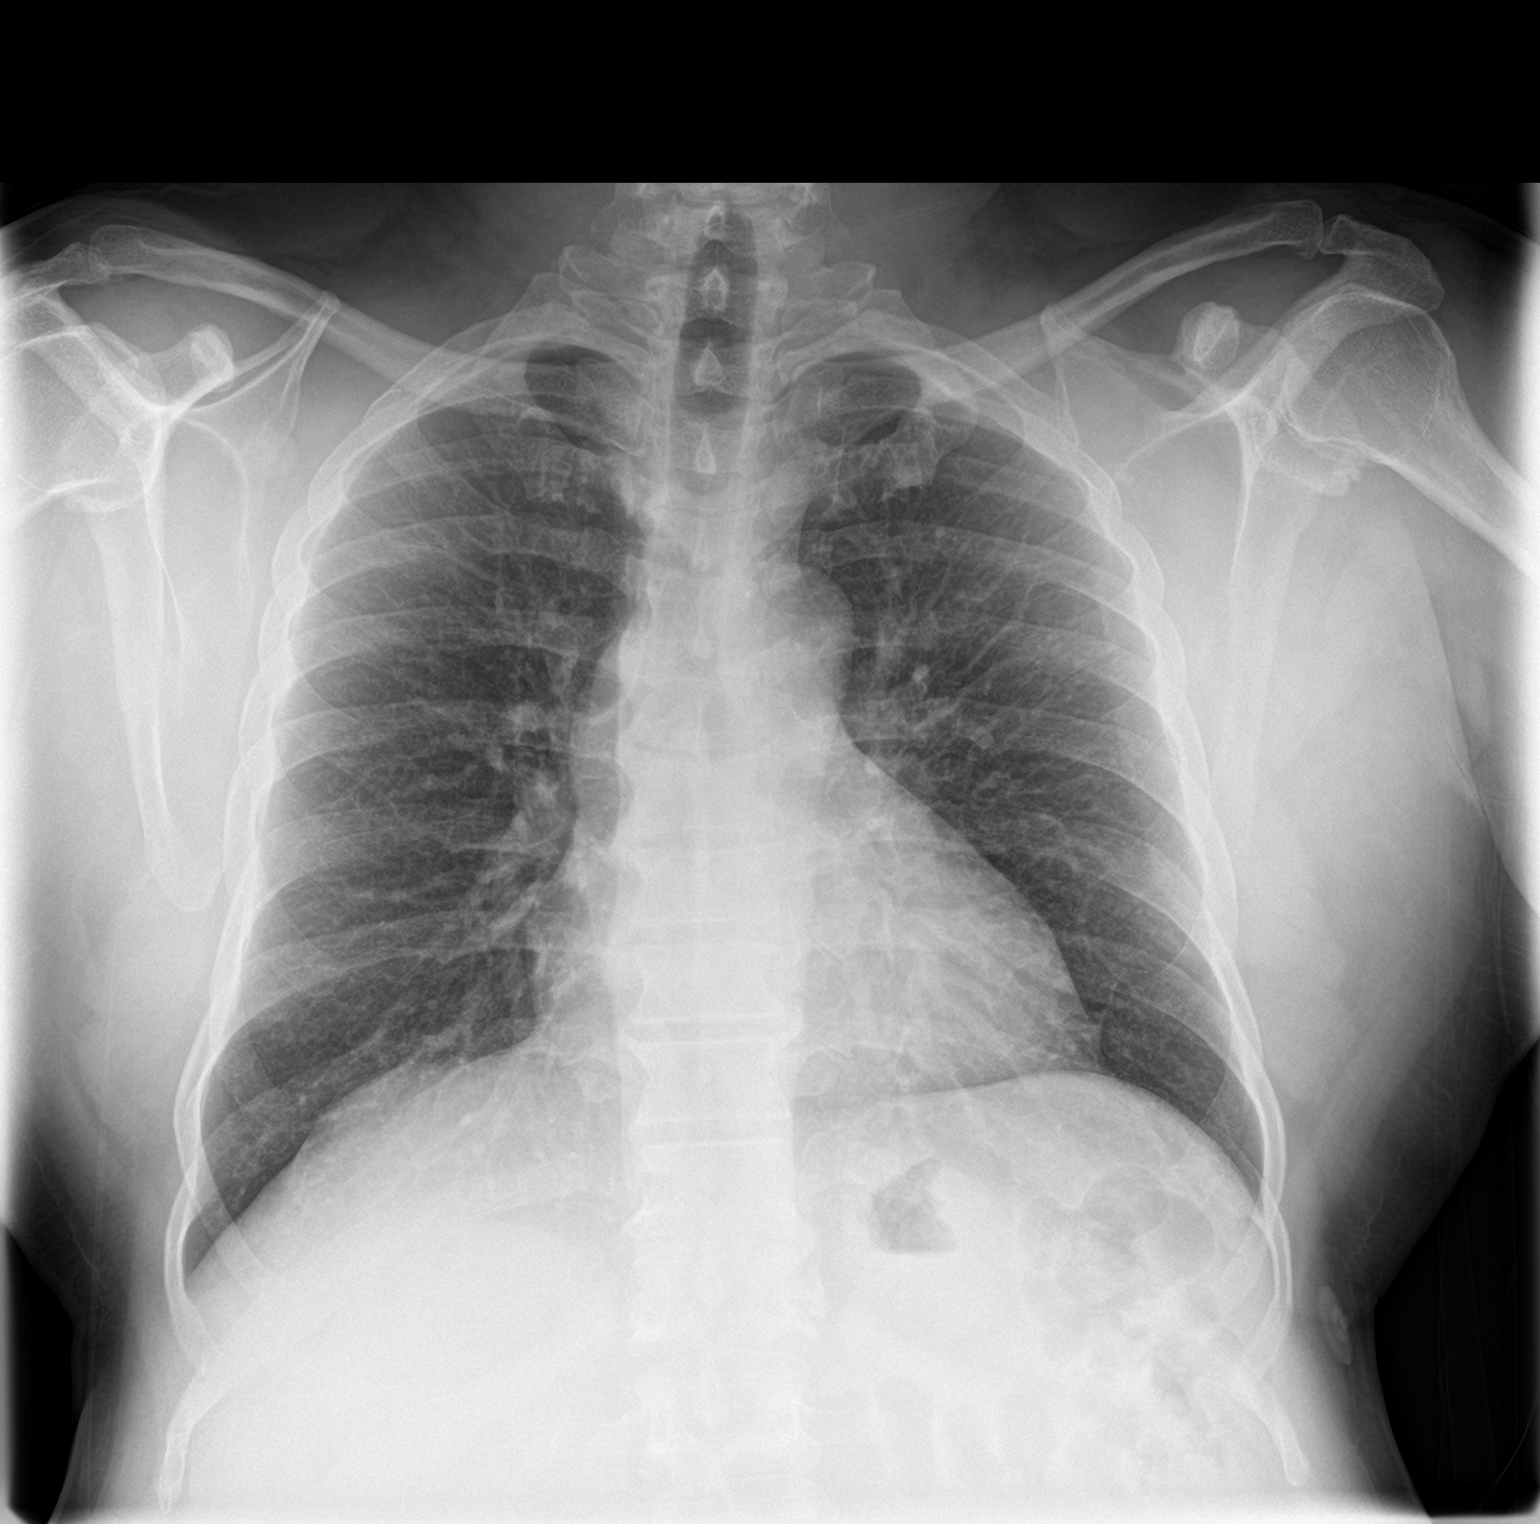

[chest lat]
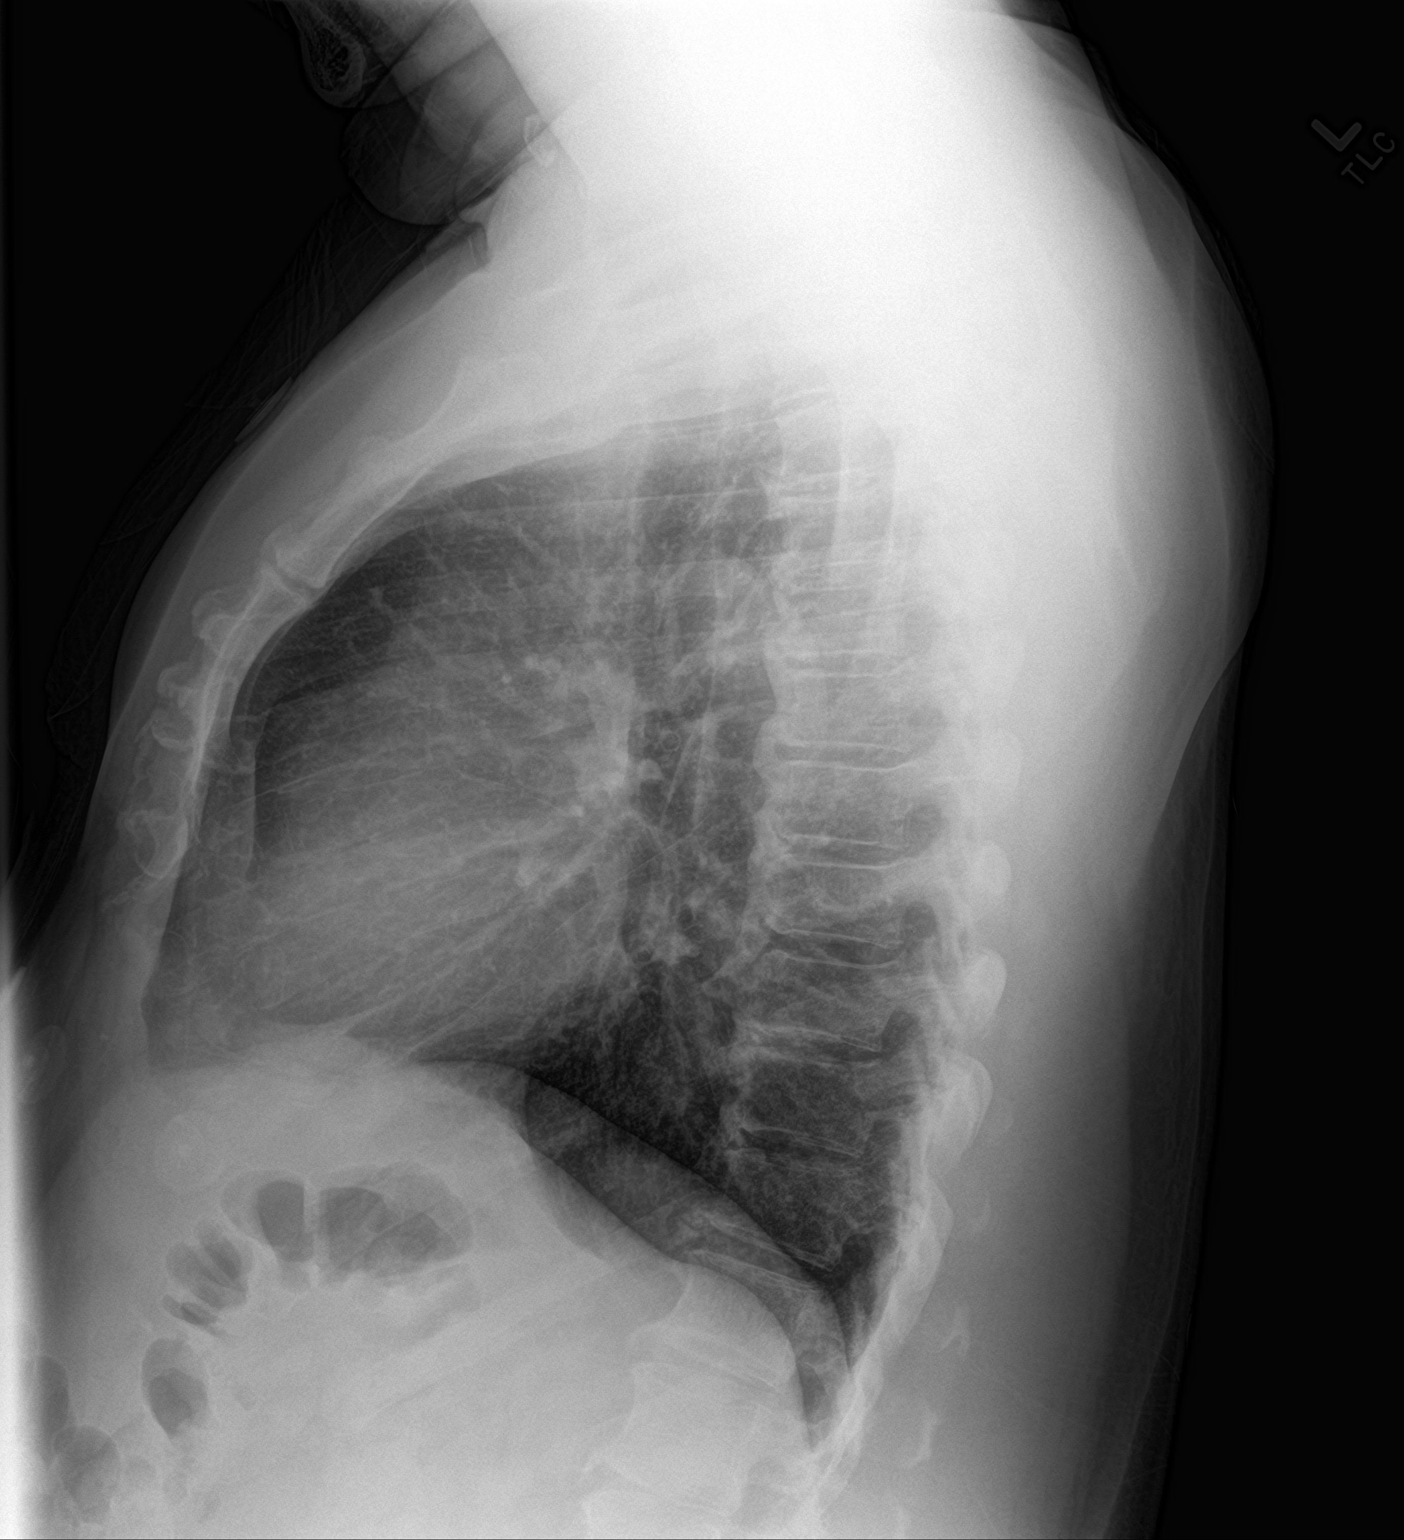

[2 of 2 positions shown; findings below may reference images not displayed]

FINDINGS: The heart size and mediastinal contours are within normal limits.
Both lungs are clear. The visualized skeletal structures are
unremarkable.
IMPRESSION: No active cardiopulmonary disease.

## 2020-06-06 ENCOUNTER — Ambulatory Visit: Payer: 59 | Admitting: Emergency Medicine

## 2020-12-24 ENCOUNTER — Other Ambulatory Visit: Payer: Self-pay | Admitting: Internal Medicine

## 2020-12-25 LAB — INFLUENZA A AND B AG, IMMUNOASSAY
INFLUENZA A ANTIGEN: NOT DETECTED
INFLUENZA B ANTIGEN: NOT DETECTED
MICRO NUMBER:: 11407358
SPECIMEN QUALITY:: ADEQUATE

## 2020-12-26 LAB — SARS-COVID-2 RNA(COVID19)AND INFLUENZA A&B, QUALITATIVE NAAT
FLU A: NOT DETECTED
FLU B: NOT DETECTED
SARS CoV2 RNA: DETECTED — AB

## 2021-03-03 ENCOUNTER — Encounter: Payer: Self-pay | Admitting: Emergency Medicine

## 2021-03-03 ENCOUNTER — Ambulatory Visit: Payer: 59 | Admitting: Emergency Medicine

## 2021-03-03 ENCOUNTER — Other Ambulatory Visit: Payer: Self-pay

## 2021-03-03 VITALS — BP 138/80 | HR 87 | Temp 98.1°F | Resp 16 | Ht 67.0 in | Wt 201.0 lb

## 2021-03-03 DIAGNOSIS — M549 Dorsalgia, unspecified: Secondary | ICD-10-CM

## 2021-03-03 LAB — POCT URINALYSIS DIP (MANUAL ENTRY)
Bilirubin, UA: NEGATIVE
Glucose, UA: NEGATIVE mg/dL
Ketones, POC UA: NEGATIVE mg/dL
Leukocytes, UA: NEGATIVE
Nitrite, UA: NEGATIVE
Protein Ur, POC: NEGATIVE mg/dL
Spec Grav, UA: 1.02 (ref 1.010–1.025)
Urobilinogen, UA: 0.2 E.U./dL
pH, UA: 7 (ref 5.0–8.0)

## 2021-03-03 MED ORDER — TRAMADOL HCL 50 MG PO TABS: 50.0000 mg | ORAL_TABLET | Freq: Three times a day (TID) | ORAL | 0 refills | Status: AC | PRN

## 2021-03-03 NOTE — Progress Notes (Signed)
Jesse Horton 60 y.o.   Chief Complaint  Patient presents with  . Back Pain    Per patient started last Monday    HISTORY OF PRESENT ILLNESS: This is a 60 y.o. adult complaining of lower back pain that started 1 week ago. 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  However high-dose ibuprofen causing heartburn and epigastric discomfort with occasional reflux.  Denies melena or rectal bleeding.  Denies nausea or vomiting.  Back Pain This is a new problem. The current episode started in the past 7 days. The problem occurs constantly. The problem is unchanged. The pain is present in the lumbar spine. The quality of the pain is described as aching. The pain does not radiate. The pain is at a severity of 8/10. The pain is moderate. The pain is the same all the time. The symptoms are aggravated by bending, lying down, position, twisting and sitting. Stiffness is present all day. Associated symptoms include abdominal pain. Pertinent negatives include no bladder incontinence, bowel incontinence, chest pain, dysuria, fever, headaches, leg pain, numbness, paresis, paresthesias, pelvic pain, perianal numbness, tingling, weakness or weight loss. Risk factors include poor posture, lack of exercise and obesity. He has tried NSAIDs for the symptoms. The treatment provided no relief.     Prior to Admission medications   Medication Sig Start Date End Date Taking? Authorizing Provider  amLODipine (NORVASC) 10 MG tablet TAKE 1 TABLET(10 MG) BY MOUTH DAILY 07/28/19  Yes Lezlie Lye, Meda Coffee, MD  atorvastatin (LIPITOR) 20 MG tablet Take 1 tablet (20 mg total) by mouth daily. Needs OV for further refills. 01/22/20  Yes Jeno Calleros, Eilleen Kempf, MD  azelastine (OPTIVAR) 0.05 % ophthalmic solution Place 1 drop into both eyes 2 (two) times daily.   Yes [provider]  lisinopril (ZESTRIL) 20 MG tablet Take 1 tablet (20 mg total) by mouth daily. 07/28/19  Yes Lezlie Lye, Irma M, MD  montelukast (SINGULAIR) 10 MG tablet Take 1 tablet (10 mg total) by mouth at bedtime. 07/28/19  Yes Lezlie Lye, Meda Coffee, MD  omeprazole (PRILOSEC) 20 MG capsule Take 1 capsule (20 mg total) by mouth daily. 01/10/19  Yes Rancour, Jeannett Senior, MD  sildenafil (VIAGRA) 50 MG tablet Take 1 tablet (50 mg total) by mouth as needed for erectile dysfunction. Needs office visit 01/09/15  Yes Pick-Jacobs, John C, DO  PROCTOZONE-HC 2.5 % rectal cream APPLY RECTALLY TO THE AFFECTED AREA TWICE DAILY Patient not taking: Reported on 03/03/2021 04/17/19   Georgina Quint, MD    No Known Allergies  Patient Active Problem List   Diagnosis Date Noted  . Erectile dysfunction due to arterial insufficiency 01/09/2015  . Elevated blood pressure 01/09/2015  . HTN (hypertension) 07/29/2012    Past Medical History:  Diagnosis Date  . High cholesterol   . Hypertension     No past surgical history on file.  Social History   Socioeconomic History  . Marital status: Single    Spouse name: Not on file  . Number of children: Not on file  . Years of education: Not on file  . Highest education level: Not on file  Occupational History  . Not on file  Tobacco Use  . Smoking status: Former Smoker    Quit date: 12/29/1998    Years since quitting: 22.1  . Smokeless  tobacco: Never Used  Substance and Sexual Activity  . Alcohol use: No  . Drug use: No  . Sexual activity: Never    Birth control/protection: Abstinence  Other Topics Concern  . Not on file  Social History Narrative  . Not on file   Social Determinants of Health   Financial Resource Strain: Not on file  Food Insecurity: Not on file  Transportation Needs: Not on file  Physical Activity: Not on file  Stress: Not on file  Social Connections: Not on file  Intimate Partner Violence: Not on file    No family  history on file.   Review of Systems  Constitutional: Negative.  Negative for fever and weight loss.  HENT: Negative.  Negative for congestion and sore throat.   Respiratory: Negative.  Negative for cough and shortness of breath.   Cardiovascular: Negative.  Negative for chest pain and palpitations.  Gastrointestinal: Positive for abdominal pain and heartburn. Negative for blood in stool, bowel incontinence and melena.  Genitourinary: Negative for bladder incontinence, dysuria and pelvic pain.  Musculoskeletal: Positive for back pain.  Skin: Negative.  Negative for rash.  Neurological: Negative for tingling, weakness, numbness, headaches and paresthesias.  All other systems reviewed and are negative.   Today's Vitals   03/03/21 1134  BP: (!) 152/91  Pulse: 87  Resp: 16  Temp: 98.1 F (36.7 C)  TempSrc: Temporal  SpO2: 99%  Weight: 201 lb (91.2 kg)  Height:  (1.702 m)   Body mass index is 31.48 kg/m. Wt Readings from Last 3 Encounters:  03/03/21 201 lb (91.2 kg)  07/28/19 213 lb (96.6 kg)  06/20/19 207 lb 3.2 oz (94 kg)    Physical Exam Vitals reviewed.  Constitutional:      Appearance: He is obese.  HENT:     Head: Normocephalic.  Eyes:     Extraocular Movements: Extraocular movements intact.     Pupils: Pupils are equal, round, and reactive to light.  Cardiovascular:     Rate and Rhythm: Normal rate and regular rhythm.     Pulses: Normal pulses.     Heart sounds: Normal heart sounds.  Pulmonary:     Effort: Pulmonary effort is normal.     Breath sounds: Normal breath sounds.  Abdominal:     General: There is no distension.     Palpations: Abdomen is soft.     Tenderness: There is no abdominal tenderness.  Musculoskeletal:     Cervical back: Normal range of motion.     Lumbar back: Spasms and tenderness present. No bony tenderness. Decreased range of motion. Positive left straight leg raise test. Negative right straight leg raise test.  Skin:     General: Skin is warm and dry.     Capillary Refill: Capillary refill takes less than 2 seconds.  Neurological:     General: No focal deficit present.     Mental Status: He is alert and oriented to person, place, and time.     Sensory: No sensory deficit.     Motor: No weakness.     Gait: Gait normal.     Deep Tendon Reflexes: Reflexes normal.  Psychiatric:        Mood and Affect: Mood normal.        Behavior: Behavior normal.    Results for orders placed or performed in visit on 03/03/21 (from the past 24 hour(s))  POCT urinalysis dipstick     Status: Abnormal   Collection Time: 03/03/21 11:53 AM  Result Value Ref Range   Color, UA yellow yellow   Clarity, UA clear clear   Glucose, UA negative negative mg/dL   Bilirubin, UA negative negative   Ketones, POC UA negative negative mg/dL   Spec Grav, UA 2.671 2.458 - 1.025   Blood, UA trace-lysed (A) negative   pH, UA 7.0 5.0 - 8.0   Protein Ur, POC negative negative mg/dL   Urobilinogen, UA 0.2 0.2 or 1.0 E.U./dL   Nitrite, UA Negative Negative   Leukocytes, UA Negative Negative     ASSESSMENT & PLAN: Shiven was seen today for back pain.  Diagnoses and all orders for this visit:  Back pain, unspecified back location, unspecified back pain laterality, unspecified chronicity -     POCT urinalysis dipstick  Musculoskeletal back pain -     traMADol (ULTRAM) 50 MG tablet; Take 1 tablet (50 mg total) by mouth every 8 (eight) hours as needed for up to 5 days.    Patient Instructions    Stop ibuprofen 800 mg tablets. Continue muscle relaxant. Tramadol as needed for pain. Stay home rested for couple of days.    If you have lab work done today you will be contacted with your lab results within the next 2 weeks.  If you have not heard from [place] then please contact us. The fastest way to get your results is to register for My Chart.   IF you received an x-ray today, you will receive an invoice from [facility].  Please contact [facility] at [phone] with questions or concerns regarding your invoice.   IF you received labwork today, you will receive an invoice from [place]. Please contact [facility] at [phone] with questions or concerns regarding your invoice.   Our billing staff will not be able to assist you with questions regarding bills from these companies.  You will be contacted with the lab results as soon as they are available. The fastest way to get your results is to activate your My Chart account. Instructions are located on the last page of this paperwork. If you have not heard from [place] regarding the results in 2 weeks, please contact this office.     Acute Back Pain, Adult Acute back pain is sudden and usually short-lived. It is often caused by an injury to the muscles and tissues in the back. The injury may result from:  A muscle or ligament getting overstretched or torn (strained). Ligaments are tissues that connect bones to each other. Lifting something improperly can cause a back strain.  Wear and tear (degeneration) of the spinal disks. Spinal disks are circular tissue that provide cushioning between the bones of the spine (vertebrae).  Twisting motions, such as while playing sports or doing yard work.  A hit to the back.  Arthritis. You may have a physical exam, lab tests, and imaging tests to find the cause of your pain. Acute back pain usually goes away with rest and home care. Follow these instructions at home: Managing pain, stiffness, and swelling  Treatment may include medicines for pain and inflammation that are taken by mouth or applied to the skin, prescription pain medicine, or muscle relaxants. Take over-the-counter and prescription medicines only as told by your health care provider.  Your health care provider may recommend applying ice during the first 24-48 hours after your pain starts. To do this: ? Put ice in a plastic bag. ? Place a towel  between your skin and the bag. ? Leave the ice on  for 20 minutes, 2-3 times a day.  If directed, apply heat to the affected area as often as told by your health care provider. Use the heat source that your health care provider recommends, such as a moist heat pack or a heating pad. ? Place a towel between your skin and the heat source. ? Leave the heat on for 20-30 minutes. ? Remove the heat if your skin turns bright red. This is especially important if you are unable to feel pain, heat, or cold. You have a greater risk of getting burned. Activity  Do not stay in bed. Staying in bed for more than 1-2 days can delay your recovery.  Sit up and stand up straight. Avoid leaning forward when you sit or hunching over when you stand. ? If you work at a desk, sit close to it so you do not need to lean over. Keep your chin tucked in. Keep your neck drawn back, and keep your elbows bent at a 90-degree angle (right angle). ? Sit high and close to the steering wheel when you drive. Add lower back (lumbar) support to your car seat, if needed.  Take short walks on even surfaces as soon as you are able. Try to increase the length of time you walk each day.  Do not sit, drive, or stand in one place for more than 30 minutes at a time. Sitting or standing for long periods of time can put stress on your back.  Do not drive or use heavy machinery while taking prescription pain medicine.  Use proper lifting techniques. When you bend and lift, use positions that put less stress on your back: ? [place] your [place]. ? Keep the load close to your body. ? Avoid twisting.  Exercise regularly as told by your health care provider. Exercising helps your back heal faster and helps prevent back injuries by keeping muscles strong and flexible.  Work with a physical therapist to make a safe exercise program, as recommended by your health care provider. Do any exercises as told by your physical therapist.    Lifestyle  Maintain a healthy weight. Extra weight puts stress on your back and makes it difficult to have good posture.  Avoid activities or situations that make you feel anxious or stressed. Stress and anxiety increase muscle tension and can make back pain worse. Learn ways to manage anxiety and stress, such as through exercise. General instructions  Sleep on a firm mattress in a comfortable position. Try lying on your side with your knees slightly bent. If you lie on your back, put a pillow under your knees.  Follow your treatment plan as told by your health care provider. This may include: ? Cognitive or behavioral therapy. ? Acupuncture or massage therapy. ? Meditation or yoga. Contact a health care provider if:  You have pain that is not relieved with rest or medicine.  You have increasing pain going down into your legs or buttocks.  Your pain does not improve after 2 weeks.  You have pain at night.  You lose weight without trying.  You have a fever or chills. Get help right away if:  You develop new bowel or bladder control problems.  You have unusual weakness or numbness in your arms or legs.  You develop nausea or vomiting.  You develop abdominal pain.  You feel faint. Summary  Acute back pain is sudden and usually short-lived.  Use proper lifting techniques. When you bend and lift, use positions that put  less stress on your back.  Take over-the-counter and prescription medicines and apply heat or ice as directed by your health care provider. This information is not intended to replace advice given to you by your health care provider. Make sure you discuss any questions you have with your health care provider. Document Revised: 08/23/2020 Document Reviewed: 08/23/2020 Elsevier Patient Education  2021 [employer]      Edwina Barth, MD [facility]

## 2021-03-03 NOTE — Patient Instructions (Addendum)
Stop ibuprofen 800 mg tablets. Continue muscle relaxant. Tramadol as needed for pain. Stay home rested for couple of days.    If you have lab work done today you will be contacted with your lab results within the next 2 weeks.  If you have not heard from [place] then please contact us. The fastest way to get your results is to register for My Chart.   IF you received an x-ray today, you will receive an invoice from [facility]. Please contact [facility] at [phone] with questions or concerns regarding your invoice.   IF you received labwork today, you will receive an invoice from [facility]. Please contact [facility] at [phone] with questions or concerns regarding your invoice.   Our billing staff will not be able to assist you with questions regarding bills from these companies.  You will be contacted with the lab results as soon as they are available. The fastest way to get your results is to activate your My Chart account. Instructions are located on the last page of this paperwork. If you have not heard from [place] regarding the results in 2 weeks, please contact this office.     Acute Back Pain, Adult Acute back pain is sudden and usually short-lived. It is often caused by an injury to the muscles and tissues in the back. The injury may result from:  A muscle or ligament getting overstretched or torn (strained). Ligaments are tissues that connect bones to each other. Lifting something improperly can cause a back strain.  Wear and tear (degeneration) of the spinal disks. Spinal disks are circular tissue that provide cushioning between the bones of the spine (vertebrae).  Twisting motions, such as while playing sports or doing yard work.  A hit to the back.  Arthritis. You may have a physical exam, lab tests, and imaging tests to find the cause of your pain. Acute back pain usually goes away with rest and home care. Follow these instructions at home: Managing  pain, stiffness, and swelling  Treatment may include medicines for pain and inflammation that are taken by mouth or applied to the skin, prescription pain medicine, or muscle relaxants. Take over-the-counter and prescription medicines only as told by your health care provider.  Your health care provider may recommend applying ice during the first 24-48 hours after your pain starts. To do this: ? Put ice in a plastic bag. ? Place a towel between your skin and the bag. ? Leave the ice on for 20 minutes, 2-3 times a day.  If directed, apply heat to the affected area as often as told by your health care provider. Use the heat source that your health care provider recommends, such as a moist heat pack or a heating pad. ? Place a towel between your skin and the heat source. ? Leave the heat on for 20-30 minutes. ? Remove the heat if your skin turns bright red. This is especially important if you are unable to feel pain, heat, or cold. You have a greater risk of getting burned. Activity  Do not stay in bed. Staying in bed for more than 1-2 days can delay your recovery.  Sit up and stand up straight. Avoid leaning forward when you sit or hunching over when you stand. ? If you work at a desk, sit close to it so you do not need to lean over. Keep your chin tucked in. Keep your neck drawn back, and keep your elbows bent at a 90-degree angle (right angle). ?  Sit high and close to the steering wheel when you drive. Add lower back (lumbar) support to your car seat, if needed.  Take short walks on even surfaces as soon as you are able. Try to increase the length of time you walk each day.  Do not sit, drive, or stand in one place for more than 30 minutes at a time. Sitting or standing for long periods of time can put stress on your back.  Do not drive or use heavy machinery while taking prescription pain medicine.  Use proper lifting techniques. When you bend and lift, use positions that put less stress  on your back: ? [place] your knees. ? Keep the load close to your body. ? Avoid twisting.  Exercise regularly as told by your health care provider. Exercising helps your back heal faster and helps prevent back injuries by keeping muscles strong and flexible.  Work with a physical therapist to make a safe exercise program, as recommended by your health care provider. Do any exercises as told by your physical therapist.   Lifestyle  Maintain a healthy weight. Extra weight puts stress on your back and makes it difficult to have good posture.  Avoid activities or situations that make you feel anxious or stressed. Stress and anxiety increase muscle tension and can make back pain worse. Learn ways to manage anxiety and stress, such as through exercise. General instructions  Sleep on a firm mattress in a comfortable position. Try lying on your side with your knees slightly bent. If you lie on your back, put a pillow under your knees.  Follow your treatment plan as told by your health care provider. This may include: ? Cognitive or behavioral therapy. ? Acupuncture or massage therapy. ? Meditation or yoga. Contact a health care provider if:  You have pain that is not relieved with rest or medicine.  You have increasing pain going down into your legs or buttocks.  Your pain does not improve after 2 weeks.  You have pain at night.  You lose weight without trying.  You have a fever or chills. Get help right away if:  You develop new bowel or bladder control problems.  You have unusual weakness or numbness in your arms or legs.  You develop nausea or vomiting.  You develop abdominal pain.  You feel faint. Summary  Acute back pain is sudden and usually short-lived.  Use proper lifting techniques. When you bend and lift, use positions that put less stress on your back.  Take over-the-counter and prescription medicines and apply heat or ice as directed by your health care  provider. This information is not intended to replace advice given to you by your health care provider. Make sure you discuss any questions you have with your health care provider. Document Revised: 08/23/2020 Document Reviewed: 08/23/2020 Elsevier Patient Education  2021 [employer].

## 2022-02-13 ENCOUNTER — Encounter: Payer: Self-pay | Admitting: Emergency Medicine

## 2022-05-05 ENCOUNTER — Other Ambulatory Visit: Payer: Self-pay | Admitting: Family Medicine

## 2022-05-05 ENCOUNTER — Ambulatory Visit
Admission: RE | Admit: 2022-05-05 | Discharge: 2022-05-05 | Disposition: A | Discharge status: Home / Self Care | Payer: 59 | Source: Ambulatory Visit | Attending: Family Medicine | Admitting: Family Medicine

## 2022-05-05 DIAGNOSIS — R109 Unspecified abdominal pain: Secondary | ICD-10-CM

## 2022-08-23 NOTE — Progress Notes (Unsigned)
New Patient Note  RE: Jesse Horton MRN: [id number] DOB: June 01, 1961 Date of Office Visit: 08/24/2022  Consult requested by: Georgina Quint, * Primary care provider: Georgina Quint, MD  Chief Complaint: No chief complaint on file.  History of Present Illness: I had the pleasure of seeing Jesse Horton for initial evaluation at the [facility] on 08/23/2022. He is a 61 y.o. adult, who is referred here by Georgina Quint, MD for the evaluation of ***.  ***  Assessment and Plan: Jesse Horton is a 61 y.o. adult with: No problem-specific Assessment & Plan notes found for this encounter.  No follow-ups on file.  No orders of the defined types were placed in this encounter.  Lab Orders  No laboratory test(s) ordered today    Other allergy screening: Asthma: {Blank single:19197::"yes","no"} Rhino conjunctivitis: {Blank single:19197::"yes","no"} Food allergy: {Blank single:19197::"yes","no"} Medication allergy: {Blank single:19197::"yes","no"} Hymenoptera allergy: {Blank single:19197::"yes","no"} Urticaria: {Blank single:19197::"yes","no"} Eczema:{Blank single:19197::"yes","no"} History of recurrent infections suggestive of immunodeficency: {Blank single:19197::"yes","no"}  Diagnostics: Spirometry:  Tracings reviewed. His effort: {Blank single:19197::"Good reproducible efforts.","It was hard to get consistent efforts and there is a question as to whether this reflects a maximal maneuver.","Poor effort, data can not be interpreted."} FVC: ***L FEV1: ***L, ***% predicted FEV1/FVC ratio: ***% Interpretation: {Blank single:19197::"Spirometry consistent with mild obstructive disease","Spirometry consistent with moderate obstructive disease","Spirometry consistent with severe obstructive disease","Spirometry consistent with possible restrictive disease","Spirometry consistent with mixed obstructive and restrictive disease","Spirometry  uninterpretable due to technique","Spirometry consistent with normal pattern","No overt abnormalities noted given today's efforts"}.  Please see scanned spirometry results for details.  Skin Testing: {Blank single:19197::"Select foods","Environmental allergy panel","Environmental allergy panel and select foods","Food allergy panel","None","Deferred due to recent antihistamines use"}. *** Results discussed with patient/family.   Past Medical History: Patient Active Problem List   Diagnosis Date Noted  . Erectile dysfunction due to arterial insufficiency 01/09/2015  . Elevated blood pressure 01/09/2015  . HTN (hypertension) 07/29/2012   Past Medical History:  Diagnosis Date  . High cholesterol   . Hypertension    Past Surgical History: No past surgical history on file. Medication List:  Current Outpatient Medications  Medication Sig Dispense Refill  . amLODipine (NORVASC) 10 MG tablet TAKE 1 TABLET(10 MG) BY MOUTH DAILY 90 tablet 1  . atorvastatin (LIPITOR) 20 MG tablet Take 1 tablet (20 mg total) by mouth daily. Needs OV for further refills. 90 tablet 0  . azelastine (OPTIVAR) 0.05 % ophthalmic solution Place 1 drop into both eyes 2 (two) times daily.    [name] lisinopril (ZESTRIL) 20 MG tablet Take 1 tablet (20 mg total) by mouth daily. 90 tablet 1  . montelukast (SINGULAIR) 10 MG tablet Take 1 tablet (10 mg total) by mouth at bedtime. 90 tablet 1  . omeprazole (PRILOSEC) 20 MG capsule Take 1 capsule (20 mg total) by mouth daily. 30 capsule 0  . PROCTOZONE-HC 2.5 % rectal cream APPLY RECTALLY TO THE AFFECTED AREA TWICE DAILY (Patient not taking: Reported on 03/03/2021) 30 g 0  . sildenafil (VIAGRA) 50 MG tablet Take 1 tablet (50 mg total) by mouth as needed for erectile dysfunction. Needs office visit 10 tablet 1   No current facility-administered medications for this visit.   Allergies: No Known Allergies Social History: Social History   Socioeconomic History  . Marital status:  Single    Spouse name: Not on file  . Number of children: Not on file  . Years of education: Not on file  . Highest education level: Not on  file  Occupational History  . Not on file  Tobacco Use  . Smoking status: Former    Types: Cigarettes    Quit date: 12/29/1998    Years since quitting: 23.6  . Smokeless tobacco: Never  Substance and Sexual Activity  . Alcohol use: No  . Drug use: No  . Sexual activity: Never    Birth control/protection: Abstinence  Other Topics Concern  . Not on file  Social History Narrative  . Not on file   Social Determinants of Health   Financial Resource Strain: Not on file  Food Insecurity: Not on file  Transportation Needs: Not on file  Physical Activity: Not on file  Stress: Not on file  Social Connections: Not on file   Lives in a ***. Smoking: *** Occupation: ***  Environmental HistorySurveyor, minerals in the house: Copywriter, advertising in the family room: {Blank single:19197::"yes","no"} Carpet in the bedroom: {Blank single:19197::"yes","no"} Heating: {Blank single:19197::"electric","gas","heat pump"} Cooling: {Blank single:19197::"central","window","heat pump"} Pet: {Blank single:19197::"yes ***","no"}  Family History: No family history on file. Problem                               Relation Asthma                                   *** Eczema                                *** Food allergy                          *** Allergic rhino conjunctivitis     ***  Review of Systems  Constitutional:  Negative for appetite change, chills, fever and unexpected weight change.  HENT:  Negative for congestion and rhinorrhea.   Eyes:  Negative for itching.  Respiratory:  Negative for cough, chest tightness, shortness of breath and wheezing.   Cardiovascular:  Negative for chest pain.  Gastrointestinal:  Negative for abdominal pain.  Genitourinary:  Negative for difficulty urinating.  Skin:  Negative for rash.   Neurological:  Negative for headaches.   Objective: There were no vitals taken for this visit. There is no height or weight on file to calculate BMI. Physical Exam Vitals and nursing note reviewed.  Constitutional:      Appearance: Normal appearance. He is well-developed.  HENT:     Head: Normocephalic and atraumatic.     Right Ear: Tympanic membrane and external ear normal.     Left Ear: Tympanic membrane and external ear normal.     Nose: Nose normal.     Mouth/Throat:     Mouth: Mucous membranes are moist.     Pharynx: Oropharynx is clear.  Eyes:     Conjunctiva/sclera: Conjunctivae normal.  Cardiovascular:     Rate and Rhythm: Normal rate and regular rhythm.     Heart sounds: Normal heart sounds. No murmur heard.    No friction rub. No gallop.  Pulmonary:     Effort: Pulmonary effort is normal.     Breath sounds: Normal breath sounds. No wheezing, rhonchi or rales.  Musculoskeletal:     Cervical back: Neck supple.  Skin:    General: Skin is warm.     Findings: No rash.  Neurological:  Mental Status: He is alert and oriented to person, place, and time.  Psychiatric:        Behavior: Behavior normal.  The plan was reviewed with the patient/family, and all questions/concerned were addressed.  It was my pleasure to see Jesse Horton today and participate in his care. Please feel free to contact me with any questions or concerns.  Sincerely,  Wyline Mood, DO Allergy & Immunology  [facility] office: [phone] [facility] office: [phone]

## 2022-08-24 ENCOUNTER — Encounter: Payer: Self-pay | Admitting: Allergy

## 2022-08-24 ENCOUNTER — Ambulatory Visit (INDEPENDENT_AMBULATORY_CARE_PROVIDER_SITE_OTHER): Payer: 59 | Admitting: Allergy

## 2022-08-24 VITALS — BP 142/90 | HR 83 | Temp 97.7°F | Resp 16 | Ht 66.54 in | Wt 205.0 lb

## 2022-08-24 DIAGNOSIS — L299 Pruritus, unspecified: Secondary | ICD-10-CM

## 2022-08-24 DIAGNOSIS — R030 Elevated blood-pressure reading, without diagnosis of hypertension: Secondary | ICD-10-CM | POA: Diagnosis not present

## 2022-08-24 DIAGNOSIS — J3089 Other allergic rhinitis: Secondary | ICD-10-CM

## 2022-08-24 DIAGNOSIS — J4540 Moderate persistent asthma, uncomplicated: Secondary | ICD-10-CM | POA: Diagnosis not present

## 2022-08-24 MED ORDER — ZYRTEC ALLERGY 10 MG PO CAPS: 10.0000 mg | ORAL_CAPSULE | Freq: Two times a day (BID) | ORAL | 2 refills | Status: AC

## 2022-08-24 NOTE — Patient Instructions (Addendum)
Requesting records from previous allergist.  Itching  Not sure what's causing your itching. See below for proper skin care. Use fragrance free and dye free products. No cologne/perfume. No dryer sheets. No fabric softener. Take zyrtec (cetirizine) 10mg  twice a day. If it makes you tired let me know.  Get bloodwork We are ordering labs, so please allow 1-2 weeks for the results to come back. With the newly implemented Cures Act, the labs might be visible to you at the same time that they become visible to me. However, I will not address the results until all of the results are back, so please be patient.  In the meantime, continue recommendations in your patient instructions, including avoidance measures (if applicable), until you hear from me.  Environmental allergies Take zyrtec as above. Continue Singulair (montelukast) 10mg  daily at night. Use azelastine nasal spray 1-2 sprays per nostril twice a day as needed for runny nose/drainage. Use Flonase (fluticasone) nasal spray 1 spray per nostril twice a day as needed for nasal congestion.  Nasal saline spray (i.e., Simply Saline) or nasal saline lavage (i.e., NeilMed) is recommended as needed and prior to medicated nasal sprays.  Asthma Daily controller medication(s): continue Symbicort 2 puffs twice a day and rinse mouth after each use.  Continue Singulair (montelukast) 10mg  daily at night. May use albuterol rescue inhaler 2 puffs every 4 to 6 hours as needed for shortness of breath, chest tightness, coughing, and wheezing. May use albuterol rescue inhaler 2 puffs 5 to 15 minutes prior to strenuous physical activities. Monitor frequency of use.  Asthma control goals:  Full participation in all desired activities (may need albuterol before activity) Albuterol use two times or less a week on average (not counting use with activity) Cough interfering with sleep two times or less a month Oral steroids no more than once a year No  hospitalizations   Follow up in 2 months or sooner if needed.  Our [place] office is moving in September 2023 to a new location. New address: [address] [place], [place], [address] [place] (white building). [place] office: [phone] (same phone number).   Skin care recommendations  Bath time: Always use lukewarm water. AVOID very hot or cold water. Keep bathing time to 5-10 minutes. Do NOT use bubble bath. Use a mild soap and use just enough to wash the dirty areas. Do NOT scrub skin vigorously.  After bathing, pat dry your skin with a towel. Do NOT rub or scrub the skin.  Moisturizers and prescriptions:  ALWAYS apply moisturizers immediately after bathing (within 3 minutes). This helps to lock-in moisture. Use the moisturizer several times a day over the whole body. Good summer moisturizers include: Aveeno, CeraVe, Cetaphil. Good winter moisturizers include: Aquaphor, Vaseline, Cerave, Cetaphil, Eucerin, Vanicream. When using moisturizers along with medications, the moisturizer should be applied about one hour after applying the medication to prevent diluting effect of the medication or moisturize around where you applied the medications. When not using medications, the moisturizer can be continued twice daily as maintenance.  Laundry and clothing: Avoid laundry products with added color or perfumes. Use unscented hypo-allergenic laundry products such as Tide free, Cheer free & gentle, and All free and clear.  If the skin still seems dry or sensitive, you can try double-rinsing the clothes. Avoid tight or scratchy clothing such as wool. Do not use fabric softeners or dyer sheets.

## 2022-08-24 NOTE — Assessment & Plan Note (Signed)
Patient has known hypertension and is on meds.  Follow up with PCP as blood pressure was elevated today 142/90.

## 2022-08-24 NOTE — Assessment & Plan Note (Signed)
Noted itching without rash since on allergy injections. No recent bloodwork. Requesting records from previous allergist. . Not sure what's causing the pruritus.  . See below for proper skin care. o Use fragrance free and dye free products. o No cologne/perfume. o No dryer sheets. o No fabric softener. . Take zyrtec (cetirizine) 10mg  twice a day. o If it makes you tired let me know. . Get bloodwork to rule out other etiologies.

## 2022-08-24 NOTE — Assessment & Plan Note (Signed)
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 Skin testing 8 months ago at outside allergist showed multiple positives and was on AIT for 8 years now with some benefit. Last injection was 4 months ago. . Discussed with patient that no need to repeat testing as it was just done within the past year. . Requesting records from prior allergist.  . Take zyrtec as above. . Continue Singulair (montelukast) 10mg  daily at night. . Use azelastine nasal spray 1-2 sprays per nostril twice a day as needed for runny nose/drainage. . Use Flonase (fluticasone) nasal spray 1 spray per nostril twice a day as needed for nasal congestion.  . Nasal saline spray (i.e., Simply Saline) or nasal saline lavage (i.e., NeilMed) is recommended as needed and prior to medicated nasal sprays.

## 2022-08-24 NOTE — Assessment & Plan Note (Signed)
Takes Symbicort 2 puffs BID for coughing.  Today's spirometry was unremarkable. . Daily controller medication(s): continue Symbicort 2 puffs twice a day and rinse mouth after each use.  . Continue Singulair (montelukast) 10mg  daily at night. . May use albuterol rescue inhaler 2 puffs every 4 to 6 hours as needed for shortness of breath, chest tightness, coughing, and wheezing. May use albuterol rescue inhaler 2 puffs 5 to 15 minutes prior to strenuous physical activities. Monitor frequency of use.

## 2022-08-26 LAB — CBC WITH DIFFERENTIAL/PLATELET
Basophils Absolute: 0 10*3/uL (ref 0.0–0.2)
Basos: 1 %
EOS (ABSOLUTE): 0.1 10*3/uL (ref 0.0–0.4)
Eos: 3 %
Hematocrit: 45.9 % (ref 37.5–51.0)
Hemoglobin: 15.6 g/dL (ref 13.0–17.7)
Immature Grans (Abs): 0 10*3/uL (ref 0.0–0.1)
Immature Granulocytes: 0 %
Lymphocytes Absolute: 1.6 10*3/uL (ref 0.7–3.1)
Lymphs: 39 %
MCH: 29.7 pg (ref 26.6–33.0)
MCHC: 34 g/dL (ref 31.5–35.7)
MCV: 87 fL (ref 79–97)
Monocytes Absolute: 0.4 10*3/uL (ref 0.1–0.9)
Monocytes: 10 %
Neutrophils Absolute: 1.9 10*3/uL (ref 1.4–7.0)
Neutrophils: 47 %
Platelets: 240 10*3/uL (ref 150–450)
RBC: 5.25 x10E6/uL (ref 4.14–5.80)
RDW: 13.7 % (ref 11.6–15.4)
WBC: 4 10*3/uL (ref 3.4–10.8)

## 2022-08-26 LAB — COMPREHENSIVE METABOLIC PANEL
ALT: 32 IU/L (ref 0–44)
AST: 27 IU/L (ref 0–40)
Albumin/Globulin Ratio: 1.3 (ref 1.2–2.2)
Albumin: 4.3 g/dL (ref 3.8–4.9)
Alkaline Phosphatase: 88 IU/L (ref 44–121)
BUN/Creatinine Ratio: 11 (ref 9–20)
BUN: 13 mg/dL (ref 6–24)
Bilirubin Total: 0.3 mg/dL (ref 0.0–1.2)
CO2: 22 mmol/L (ref 20–29)
Calcium: 9.5 mg/dL (ref 8.7–10.2)
Chloride: 100 mmol/L (ref 96–106)
Creatinine, Ser: 1.2 mg/dL (ref 0.76–1.27)
Globulin, Total: 3.3 g/dL (ref 1.5–4.5)
Glucose: 100 mg/dL — ABNORMAL HIGH (ref 70–99)
Potassium: 3.6 mmol/L (ref 3.5–5.2)
Sodium: 139 mmol/L (ref 134–144)
Total Protein: 7.6 g/dL (ref 6.0–8.5)
eGFR: 70 mL/min/{1.73_m2} (ref >59)

## 2022-08-26 LAB — ALPHA-GAL PANEL
Allergen Lamb IgE: <0.1 kU/L
Beef IgE: <0.1 kU/L
IgE (Immunoglobulin E), Serum: 52 IU/mL (ref 6–495)
O215-IgE Alpha-Gal: <0.1 kU/L
Pork IgE: <0.1 kU/L

## 2022-08-26 LAB — TRYPTASE: Tryptase: 6.2 ug/L (ref 2.2–13.2)

## 2022-08-26 LAB — SEDIMENTATION RATE: Sed Rate: 27 mm/hr (ref 0–30)

## 2022-08-26 LAB — C-REACTIVE PROTEIN: CRP: 3 mg/L (ref 0–10)

## 2022-08-26 LAB — THYROID CASCADE PROFILE: TSH: 0.982 u[IU]/mL (ref 0.450–4.500)

## 2022-08-26 LAB — ANA W/REFLEX: Anti Nuclear Antibody (ANA): NEGATIVE

## 2022-09-13 ENCOUNTER — Other Ambulatory Visit: Payer: Self-pay | Admitting: Emergency Medicine

## 2022-09-14 ENCOUNTER — Encounter: Payer: Self-pay | Admitting: Allergy

## 2022-09-14 NOTE — Progress Notes (Signed)
Reviewed notes from [place]. Date of service: multiple. See scanned notes for full documentation. Last OV on 10/17/2021. Assessment - allergic rhino conjunctivitis, GERD and cough. 12/27/2017 intradermal skin testing was positive to trees, grass, dust mites, cockroach, dog. Negative to select foods. 2013 allergy immunotherapy included trees, grass, weed, ragweed, and dust mites, cat, dog, cockroach.

## 2022-09-17 ENCOUNTER — Other Ambulatory Visit: Payer: Self-pay | Admitting: Emergency Medicine

## 2022-10-27 NOTE — Progress Notes (Deleted)
Follow Up Note  RE: Jesse Horton MRN: [id number] DOB: June 06, 1961 Date of Office Visit: 10/28/2022  Referring provider: Horald Pollen, * Primary care provider: Horald Pollen, MD  Chief Complaint: No chief complaint on file.  History of Present Illness: I had the pleasure of seeing Jesse Horton for a follow up visit at the [facility] [place] [facility] on 10/27/2022. He is a 61 y.o. adult, who is being followed for pruritus, allergic rhinitis, asthma. His previous allergy office visit was on 08/24/2022 with Dr. Maudie Mercury. Today is a regular follow up visit.  Assessment - allergic rhino conjunctivitis, GERD and cough. 12/27/2017 intradermal skin testing was positive to trees, grass, dust mites, cockroach, dog. Negative to select foods. 2013 allergy immunotherapy included trees, grass, weed, ragweed, and dust mites, cat, dog, cockroach.  ruritus Noted itching without rash since on allergy injections. No recent bloodwork. Requesting records from previous allergist. Not sure what's causing the pruritus.  See below for proper skin care. Use fragrance free and dye free products. No cologne/perfume. No dryer sheets. No fabric softener. Take zyrtec (cetirizine) 10mg  twice a day. If it makes you tired let me know. Get bloodwork to rule out other etiologies.    Other allergic rhinitis Perennial rhino conjunctivitis symptoms for 40+ years. Skin testing 8 months ago at outside allergist showed multiple positives and was on AIT for 8 years now with some benefit. Last injection was 4 months ago. Discussed with patient that no need to repeat testing as it was just done within the past year. Requesting records from prior allergist.  Take zyrtec as above. Continue Singulair (montelukast) 10mg  daily at night. Use azelastine nasal spray 1-2 sprays per nostril twice a day as needed for runny nose/drainage. Use Flonase (fluticasone) nasal spray 1 spray per nostril twice a day as  needed for nasal congestion.  Nasal saline spray (i.e., Simply Saline) or nasal saline lavage (i.e., NeilMed) is recommended as needed and prior to medicated nasal sprays.   Moderate persistent asthma without complication Takes Symbicort 182mcg 2 puffs BID for coughing. Today's spirometry was unremarkable. Daily controller medication(s): continue Symbicort 171mcg 2 puffs twice a day and rinse mouth after each use.  Continue Singulair (montelukast) 10mg  daily at night. May use albuterol rescue inhaler 2 puffs every 4 to 6 hours as needed for shortness of breath, chest tightness, coughing, and wheezing. May use albuterol rescue inhaler 2 puffs 5 to 15 minutes prior to strenuous physical activities. Monitor frequency of use.    Elevated blood pressure reading Patient has known hypertension and is on meds. Follow up with PCP as blood pressure was elevated today 142/90.   Return in about 2 months (around 10/24/2022).  Assessment and Plan: Jesse Horton is a 61 y.o. adult with: No problem-specific Assessment & Plan notes found for this encounter.  No follow-ups on file.  No orders of the defined types were placed in this encounter.  Lab Orders  No laboratory test(s) ordered today    Diagnostics: Spirometry:  Tracings reviewed. His effort: {Blank single:19197::"Good reproducible efforts.","It was hard to get consistent efforts and there is a question as to whether this reflects a maximal maneuver.","Poor effort, data can not be interpreted."} FVC: ***L FEV1: ***L, ***% predicted FEV1/FVC ratio: ***% Interpretation: {Blank single:19197::"Spirometry consistent with mild obstructive disease","Spirometry consistent with moderate obstructive disease","Spirometry consistent with severe obstructive disease","Spirometry consistent with possible restrictive disease","Spirometry consistent with mixed obstructive and restrictive disease","Spirometry uninterpretable due to technique","Spirometry consistent  with normal pattern","No overt abnormalities noted  given today's efforts"}.  Please see scanned spirometry results for details.  Skin Testing: {Blank single:19197::"Select foods","Environmental allergy panel","Environmental allergy panel and select foods","Food allergy panel","None","Deferred due to recent antihistamines use"}. *** Results discussed with patient/family.   Medication List:  Current Outpatient Medications  Medication Sig Dispense Refill   albuterol (VENTOLIN HFA) 108 (90 Base) MCG/ACT inhaler 2puffs as needed     amLODipine (NORVASC) 10 MG tablet TAKE 1 TABLET(10 MG) BY MOUTH DAILY 90 tablet 1   atorvastatin (LIPITOR) 20 MG tablet Take 1 tablet (20 mg total) by mouth daily. Needs OV for further refills. 90 tablet 0   azelastine (ASTELIN) 0.1 % nasal spray SMARTSIG:1-2 Spray(s) Both Nares Twice Daily     azelastine (OPTIVAR) 0.05 % ophthalmic solution Place 1 drop into both eyes 2 (two) times daily.     Cetirizine HCl (ZYRTEC ALLERGY) 10 MG CAPS Take 1 capsule (10 mg total) by mouth in the morning and at bedtime. For itching 60 capsule 2   DEXILANT 60 MG capsule Take 1 capsule by mouth daily.     dorzolamide-timolol (COSOPT) 22.3-6.8 MG/ML ophthalmic solution dorzolamide hcl/timolol maleate 22.3-6.8 mg/ml soln     EPINEPHrine (EPIPEN 2-PAK) 0.3 mg/0.3 mL IJ SOAJ injection See admin instructions.     famotidine (PEPCID) 20 MG tablet Take 20 mg by mouth 2 (two) times daily.     fluticasone (FLONASE) 50 MCG/ACT nasal spray 1-2 sprays each nostril     ibuprofen (ADVIL) 800 MG tablet Take 800 mg by mouth every 8 (eight) hours as needed.     ketorolac (TORADOL) 60 MG/2ML SOLN injection      lisinopril (ZESTRIL) 20 MG tablet Take 1 tablet (20 mg total) by mouth daily. 90 tablet 1   montelukast (SINGULAIR) 10 MG tablet Take 1 tablet (10 mg total) by mouth at bedtime. 90 tablet 1   omeprazole (PRILOSEC) 20 MG capsule Take 1 capsule (20 mg total) by mouth daily. 30 capsule 0    sildenafil (VIAGRA) 50 MG tablet Take 1 tablet (50 mg total) by mouth as needed for erectile dysfunction. Needs office visit 10 tablet 1   SYMBICORT 160-4.5 MCG/ACT inhaler SMARTSIG:2 Puff(s) By Mouth Twice Daily     Vitamin D, Ergocalciferol, (DRISDOL) 1.25 MG (50000 UNIT) CAPS capsule Take 50,000 Units by mouth once a week.     No current facility-administered medications for this visit.   Allergies: No Known Allergies I reviewed his past medical history, social history, family history, and environmental history and no significant changes have been reported from his previous visit.  Review of Systems  Constitutional:  Negative for appetite change, chills, fever and unexpected weight change.  HENT:  Positive for congestion and rhinorrhea.   Eyes:  Negative for itching.  Respiratory:  Positive for cough. Negative for chest tightness, shortness of breath and wheezing.   Cardiovascular:  Negative for chest pain.  Gastrointestinal:  Negative for abdominal pain.  Genitourinary:  Negative for difficulty urinating.  Skin:  Negative for rash.       itching  Neurological:  Positive for headaches.    Objective: There were no vitals taken for this visit. There is no height or weight on file to calculate BMI. Physical Exam Vitals and nursing note reviewed.  Constitutional:      Appearance: Normal appearance. He is well-developed.  HENT:     Head: Normocephalic and atraumatic.     Right Ear: Tympanic membrane and external ear normal.     Left Ear: Tympanic membrane and  external ear normal.     Nose: Nose normal.     Mouth/Throat:     Mouth: Mucous membranes are moist.     Pharynx: Oropharynx is clear.  Eyes:     Conjunctiva/sclera: Conjunctivae normal.  Cardiovascular:     Rate and Rhythm: Normal rate and regular rhythm.     Heart sounds: Normal heart sounds. No murmur heard.    No friction rub. No gallop.  Pulmonary:     Effort: Pulmonary effort is normal.     Breath sounds: Normal  breath sounds. No wheezing, rhonchi or rales.  Musculoskeletal:     Cervical back: Neck supple.  Skin:    General: Skin is warm.     Findings: No rash.  Neurological:     Mental Status: He is alert and oriented to person, place, and time.  Psychiatric:        Behavior: Behavior normal.    Previous notes and tests were reviewed. The plan was reviewed with the patient/family, and all questions/concerned were addressed.  It was my pleasure to see Joaovictor today and participate in his care. Please feel free to contact me with any questions or concerns.  Sincerely,  Rexene Alberts, DO Allergy & Immunology  [facility] office: [place] office: [phone]

## 2022-10-28 ENCOUNTER — Ambulatory Visit: Payer: 59 | Admitting: Allergy

## 2022-10-28 DIAGNOSIS — J3089 Other allergic rhinitis: Secondary | ICD-10-CM

## 2022-10-28 DIAGNOSIS — L299 Pruritus, unspecified: Secondary | ICD-10-CM

## 2022-10-28 DIAGNOSIS — J4540 Moderate persistent asthma, uncomplicated: Secondary | ICD-10-CM

## 2022-11-04 ENCOUNTER — Ambulatory Visit: Payer: 59 | Admitting: Allergy

## 2022-11-17 NOTE — Progress Notes (Deleted)
Follow Up Note  RE: Jesse Horton MRN: [id number] DOB: May 10, 1961 Date of Office Visit: 11/18/2022  Referring provider: Georgina Quint, * Primary care provider: Georgina Quint, MD  Chief Complaint: No chief complaint on file.  History of Present Illness: I had the pleasure of seeing Jesse Horton for a follow up visit at the [facility] on 11/17/2022. He is a 61 y.o. adult, who is being followed for pruritus, allergic rhinitis, asthma. His previous allergy office visit was on 08/24/2022 with Dr. Selena Batten. Today is a regular follow up visit.  Reviewed notes from [facility]. Date of service: multiple. See scanned notes for full documentation. Last OV on 10/17/2021. Assessment - allergic rhino conjunctivitis, GERD and cough. 12/27/2017 intradermal skin testing was positive to trees, grass, dust mites, cockroach, dog. Negative to select foods. 2013 allergy immunotherapy included trees, grass, weed, ragweed, and dust mites, cat, dog, cockroach.      Pruritus Noted itching without rash since on allergy injections. No recent bloodwork. Requesting records from previous allergist. Not sure what's causing the pruritus.  See below for proper skin care. Use fragrance free and dye free products. No cologne/perfume. No dryer sheets. No fabric softener. Take zyrtec (cetirizine) 10mg  twice a day. If it makes you tired let me know. Get bloodwork to rule out other etiologies.    Other allergic rhinitis Perennial rhino conjunctivitis symptoms for 40+ years. Skin testing 8 months ago at outside allergist showed multiple positives and was on AIT for 8 years now with some benefit. Last injection was 4 months ago. Discussed with patient that no need to repeat testing as it was just done within the past year. Requesting records from prior allergist.  Take zyrtec as above. Continue Singulair (montelukast) 10mg  daily at night. Use azelastine nasal spray 1-2  sprays per nostril twice a day as needed for runny nose/drainage. Use Flonase (fluticasone) nasal spray 1 spray per nostril twice a day as needed for nasal congestion.  Nasal saline spray (i.e., Simply Saline) or nasal saline lavage (i.e., NeilMed) is recommended as needed and prior to medicated nasal sprays.   Moderate persistent asthma without complication Takes Symbicort 2 puffs BID for coughing. Today's spirometry was unremarkable. Daily controller medication(s): continue Symbicort 2 puffs twice a day and rinse mouth after each use.  Continue Singulair (montelukast) 10mg  daily at night. May use albuterol rescue inhaler 2 puffs every 4 to 6 hours as needed for shortness of breath, chest tightness, coughing, and wheezing. May use albuterol rescue inhaler 2 puffs 5 to 15 minutes prior to strenuous physical activities. Monitor frequency of use.    Elevated blood pressure reading Patient has known hypertension and is on meds. Follow up with PCP as blood pressure was elevated today 142/90.  Assessment and Plan: Jesse Horton is a 61 y.o. adult with: No problem-specific Assessment & Plan notes found for this encounter.  No follow-ups on file.  No orders of the defined types were placed in this encounter.  Lab Orders  No laboratory test(s) ordered today    Diagnostics: Spirometry:  Tracings reviewed. His effort: {Blank single:19197::"Good reproducible efforts.","It was hard to get consistent efforts and there is a question as to whether this reflects a maximal maneuver.","Poor effort, data can not be interpreted."} FVC: ***L FEV1: ***L, ***% predicted FEV1/FVC ratio: ***% Interpretation: {Blank single:19197::"Spirometry consistent with mild obstructive disease","Spirometry consistent with moderate obstructive disease","Spirometry consistent with severe obstructive disease","Spirometry consistent with possible restrictive disease","Spirometry consistent with mixed obstructive and  restrictive disease","Spirometry uninterpretable due to technique","Spirometry consistent with normal pattern","No overt abnormalities noted given today's efforts"}.  Please see scanned spirometry results for details.  Skin Testing: {Blank single:19197::"Select foods","Environmental allergy panel","Environmental allergy panel and select foods","Food allergy panel","None","Deferred due to recent antihistamines use"}. *** Results discussed with patient/family.   Medication List:  Current Outpatient Medications  Medication Sig Dispense Refill   albuterol (VENTOLIN HFA) 108 (90 Base) MCG/ACT inhaler 2puffs as needed     amLODipine (NORVASC) 10 MG tablet TAKE 1 TABLET(10 MG) BY MOUTH DAILY 90 tablet 1   atorvastatin (LIPITOR) 20 MG tablet Take 1 tablet (20 mg total) by mouth daily. Needs OV for further refills. 90 tablet 0   azelastine (ASTELIN) 0.1 % nasal spray SMARTSIG:1-2 Spray(s) Both Nares Twice Daily     azelastine (OPTIVAR) 0.05 % ophthalmic solution Place 1 drop into both eyes 2 (two) times daily.     Cetirizine HCl (ZYRTEC ALLERGY) 10 MG CAPS Take 1 capsule (10 mg total) by mouth in the morning and at bedtime. For itching 60 capsule 2   DEXILANT 60 MG capsule Take 1 capsule by mouth daily.     dorzolamide-timolol (COSOPT) 22.3-6.8 MG/ML ophthalmic solution dorzolamide hcl/timolol maleate 22.3-6.8 mg/ml soln     EPINEPHrine (EPIPEN 2-PAK) 0.3 mg/0.3 mL IJ SOAJ injection See admin instructions.     famotidine (PEPCID) 20 MG tablet Take 20 mg by mouth 2 (two) times daily.     fluticasone (FLONASE) 50 MCG/ACT nasal spray 1-2 sprays each nostril     ibuprofen (ADVIL) 800 MG tablet Take 800 mg by mouth every 8 (eight) hours as needed.     ketorolac (TORADOL) 60 MG/2ML SOLN injection      lisinopril (ZESTRIL) 20 MG tablet Take 1 tablet (20 mg total) by mouth daily. 90 tablet 1   montelukast (SINGULAIR) 10 MG tablet Take 1 tablet (10 mg total) by mouth at bedtime. 90 tablet 1   omeprazole  (PRILOSEC) 20 MG capsule Take 1 capsule (20 mg total) by mouth daily. 30 capsule 0   sildenafil (VIAGRA) 50 MG tablet Take 1 tablet (50 mg total) by mouth as needed for erectile dysfunction. Needs office visit 10 tablet 1   SYMBICORT 160-4.5 MCG/ACT inhaler SMARTSIG:2 Puff(s) By Mouth Twice Daily     Vitamin D, Ergocalciferol, (DRISDOL) 1.25 MG (50000 UNIT) CAPS capsule Take 50,000 Units by mouth once a week.     No current facility-administered medications for this visit.   Allergies: No Known Allergies I reviewed his past medical history, social history, family history, and environmental history and no significant changes have been reported from his previous visit.  Review of Systems  Constitutional:  Negative for appetite change, chills, fever and unexpected weight change.  HENT:  Positive for congestion and rhinorrhea.   Eyes:  Negative for itching.  Respiratory:  Positive for cough. Negative for chest tightness, shortness of breath and wheezing.   Cardiovascular:  Negative for chest pain.  Gastrointestinal:  Negative for abdominal pain.  Genitourinary:  Negative for difficulty urinating.  Skin:  Negative for rash.       itching  Neurological:  Positive for headaches.    Objective: There were no vitals taken for this visit. There is no height or weight on file to calculate BMI. Physical Exam Vitals and nursing note reviewed.  Constitutional:      Appearance: Normal appearance. He is well-developed.  HENT:     Head: Normocephalic and atraumatic.     Right Ear: Tympanic membrane  and external ear normal.     Left Ear: Tympanic membrane and external ear normal.     Nose: Nose normal.     Mouth/Throat:     Mouth: Mucous membranes are moist.     Pharynx: Oropharynx is clear.  Eyes:     Conjunctiva/sclera: Conjunctivae normal.  Cardiovascular:     Rate and Rhythm: Normal rate and regular rhythm.     Heart sounds: Normal heart sounds. No murmur heard.    No friction rub. No  gallop.  Pulmonary:     Effort: Pulmonary effort is normal.     Breath sounds: Normal breath sounds. No wheezing, rhonchi or rales.  Musculoskeletal:     Cervical back: Neck supple.  Skin:    General: Skin is warm.     Findings: No rash.  Neurological:     Mental Status: He is alert and oriented to person, place, and time.  Psychiatric:        Behavior: Behavior normal.    Previous notes and tests were reviewed. The plan was reviewed with the patient/family, and all questions/concerned were addressed.  It was my pleasure to see Emily today and participate in his care. Please feel free to contact me with any questions or concerns.  Sincerely,  Wyline Mood, DO Allergy & Immunology  [facility] office: [phone] [facility] office: [phone]

## 2022-11-18 ENCOUNTER — Ambulatory Visit: Payer: 59 | Admitting: Allergy

## 2022-11-18 DIAGNOSIS — J4540 Moderate persistent asthma, uncomplicated: Secondary | ICD-10-CM

## 2022-11-18 DIAGNOSIS — J3089 Other allergic rhinitis: Secondary | ICD-10-CM

## 2022-11-18 DIAGNOSIS — J309 Allergic rhinitis, unspecified: Secondary | ICD-10-CM

## 2022-11-18 DIAGNOSIS — L299 Pruritus, unspecified: Secondary | ICD-10-CM

## 2023-05-26 ENCOUNTER — Telehealth: Payer: 59 | Admitting: Physician Assistant

## 2023-05-26 ENCOUNTER — Other Ambulatory Visit: Payer: Self-pay | Admitting: Emergency Medicine

## 2023-05-26 DIAGNOSIS — J019 Acute sinusitis, unspecified: Secondary | ICD-10-CM

## 2023-05-26 DIAGNOSIS — B9789 Other viral agents as the cause of diseases classified elsewhere: Secondary | ICD-10-CM

## 2023-05-27 IMAGING — CT CT ABD-PELV W/O CM
2 of 4 series · 15 of 46 positions shown, 17 images · non-contrast
Comparison: None Available.

CLINICAL DATA: Acute right flank pain, dysuria.



[Series 2: routine abdomen pelvis without 5.00 br40 s3 axial · axial · non-contrast · 0.68mm/px · z∈[+1204,+1589]mm · 12 of 85 slices shown, 14 images]
[im 4/85  soft-tissue]
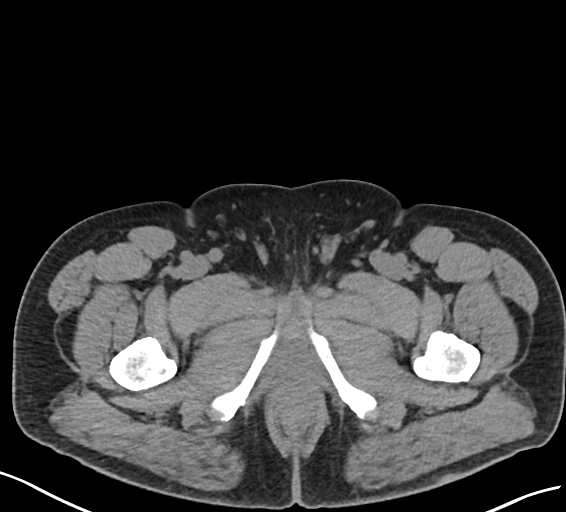
[im 4/85  bone]
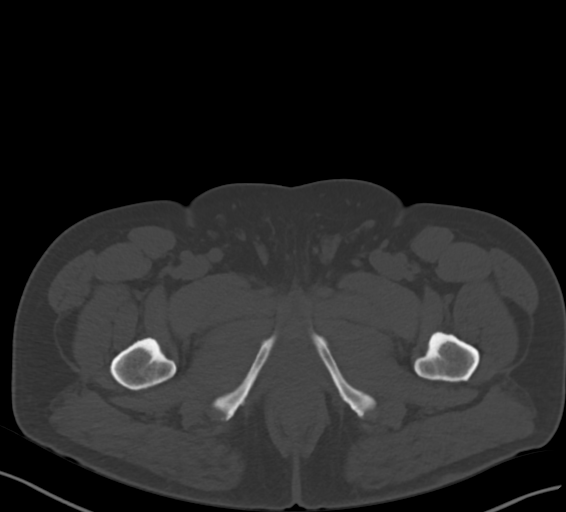
[im 12/85  soft-tissue]
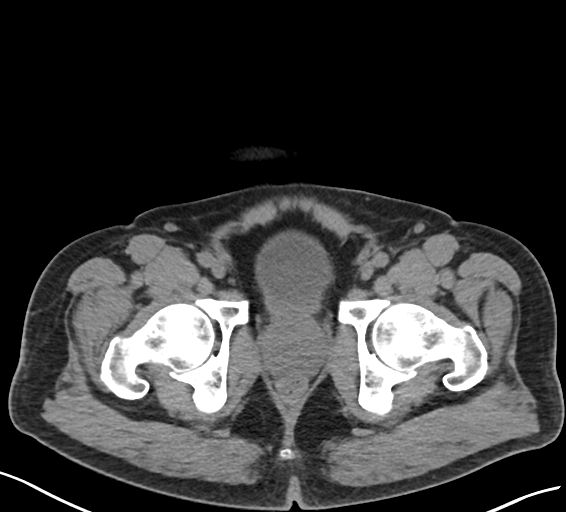
[im 20/85  soft-tissue]
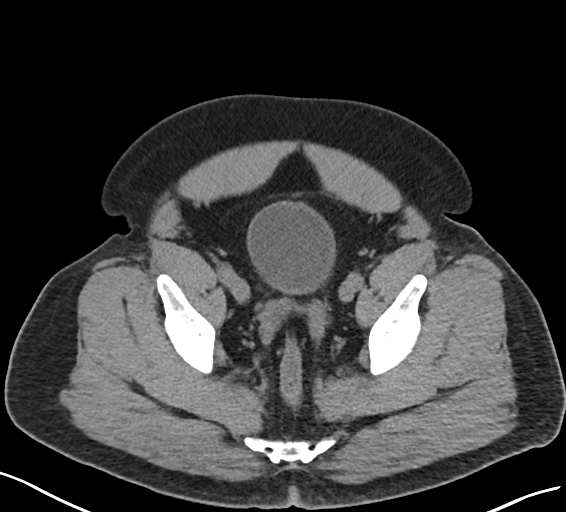
[im 27/85  soft-tissue]
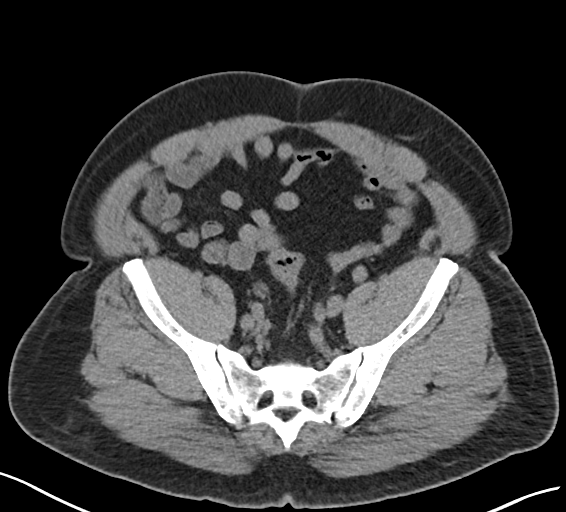
[im 31/85  soft-tissue]
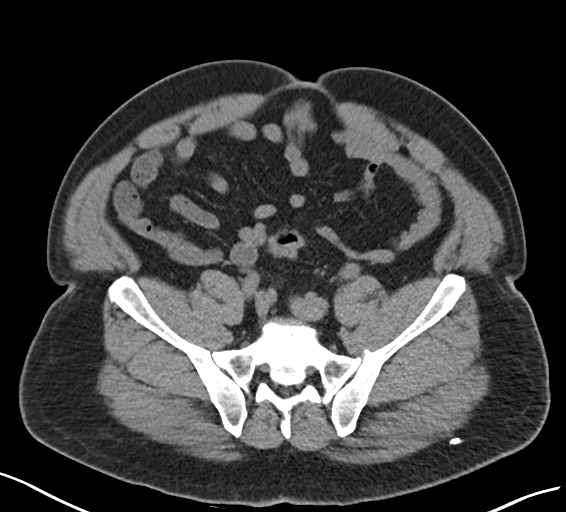
[im 39/85  soft-tissue]
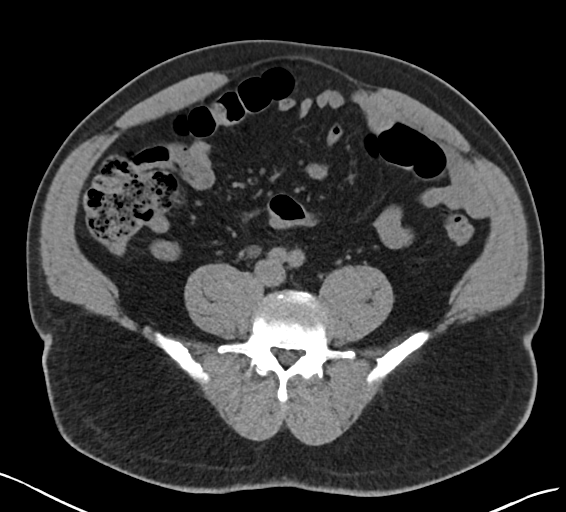
[im 46/85  soft-tissue]
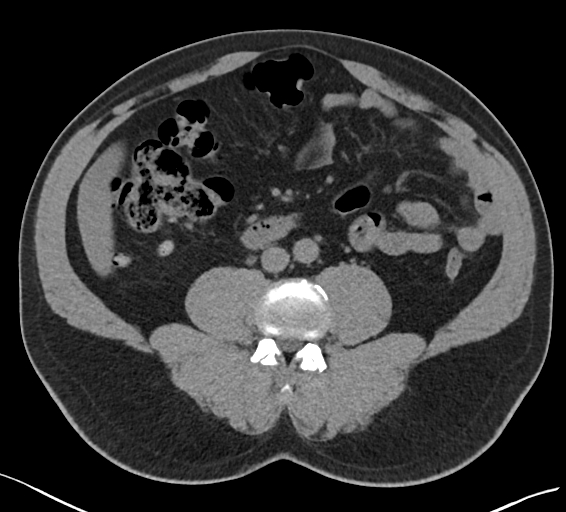
[im 54/85  soft-tissue]
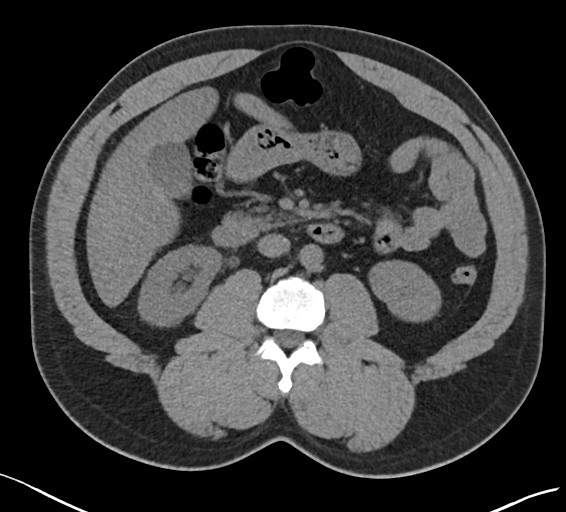
[im 58/85  soft-tissue]
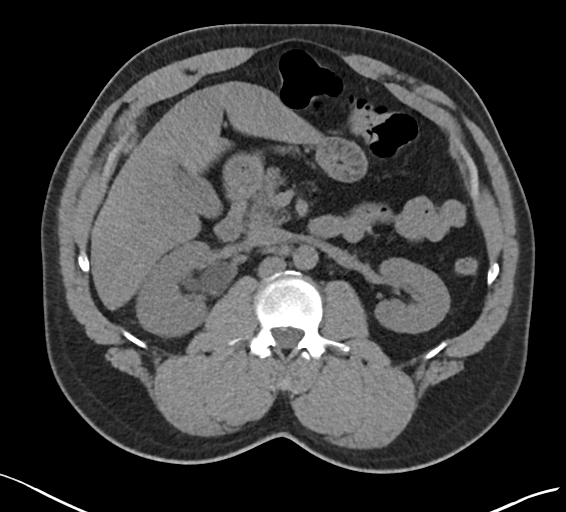
[im 58/85  bone]
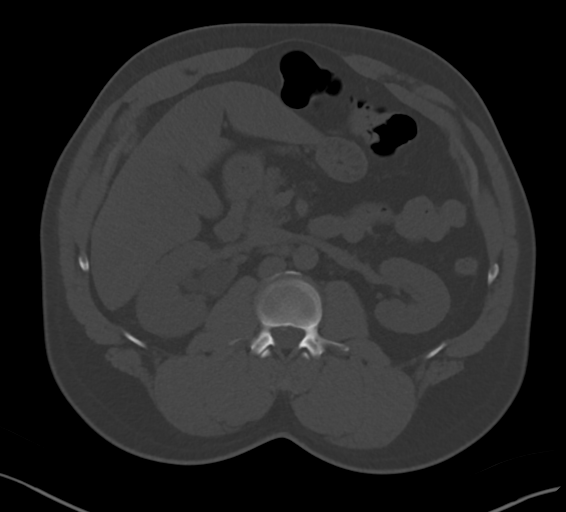
[im 65/85  soft-tissue]
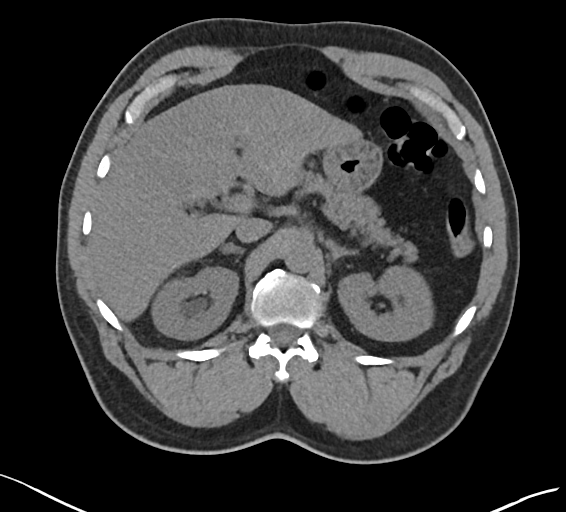
[im 73/85  soft-tissue]
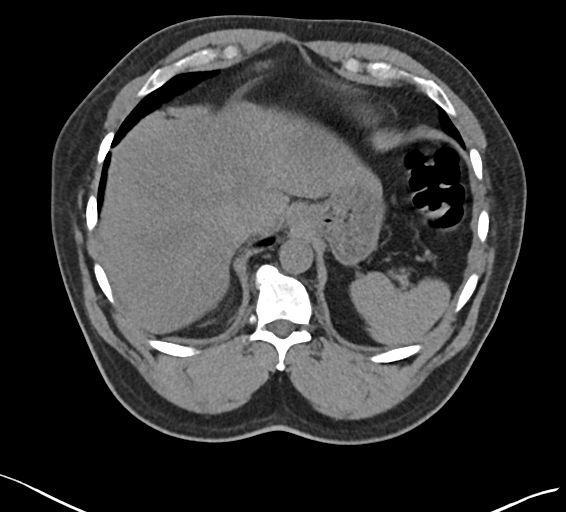
[im 81/85  soft-tissue]
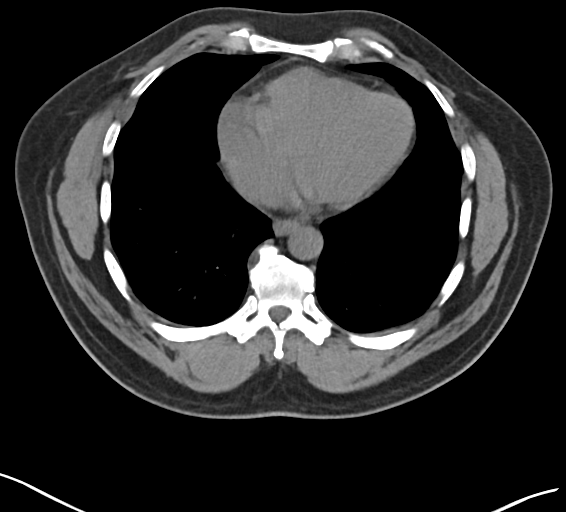

[Series 4: routine abdomen pelvis without 2.00 br40 s3 cor · coronal · non-contrast · 0.75mm/px · 3 of 174 slices shown]
[im 58/174  soft-tissue]
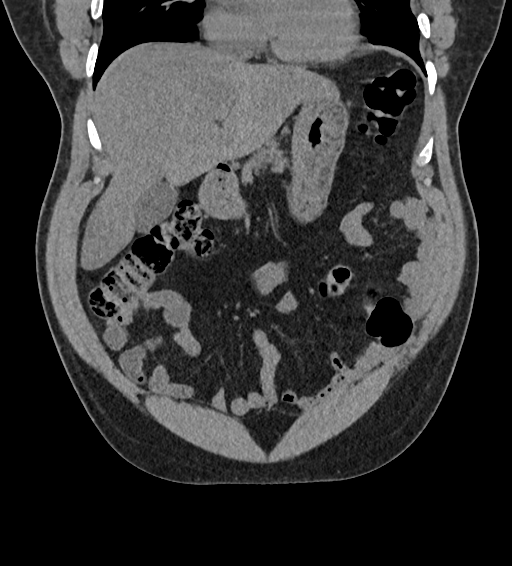
[im 77/174  soft-tissue]
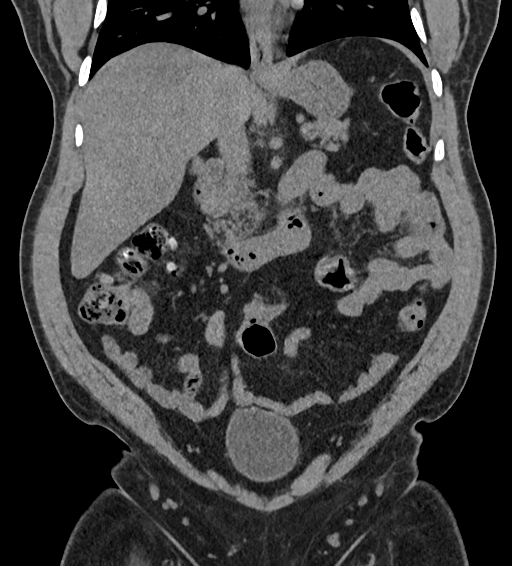
[im 97/174  soft-tissue]
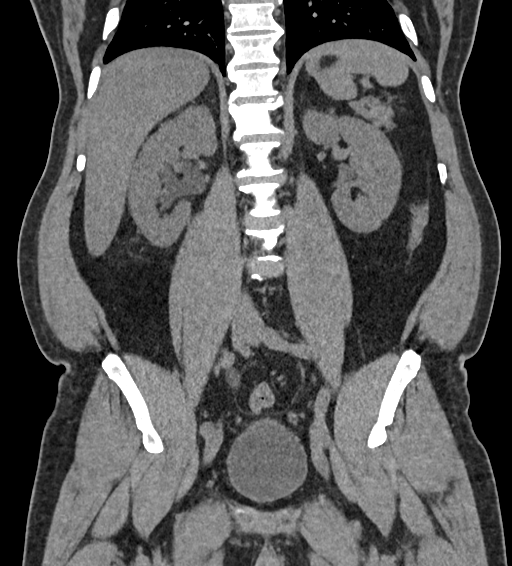

[15 of 46 positions shown; findings below may reference images not displayed]

FINDINGS: Lower chest: No acute abnormality.

Hepatobiliary: No gallstones or biliary dilatation is noted. Hepatic
steatosis.

Pancreas: Unremarkable. No pancreatic ductal dilatation or
surrounding inflammatory changes.

Spleen: Normal in size without focal abnormality.

Adrenals/Urinary Tract: Adrenal glands appear normal. Mild right
hydroureteronephrosis is noted. 8 mm calculus is seen in the urinary
bladder posteriorly which most likely represents recently passed
stone.

Stomach/Bowel: Stomach is within normal limits. Appendix appears
normal. No evidence of bowel wall thickening, distention, or
inflammatory changes.

Vascular/Lymphatic: No significant vascular findings are present. No
enlarged abdominal or pelvic lymph nodes.

Reproductive: Prostate is unremarkable.

Other: No abdominal wall hernia or abnormality. No abdominopelvic
ascites.

Musculoskeletal: No acute or significant osseous findings.
IMPRESSION: Mild right hydroureteronephrosis is noted. 8 mm calculus is seen in
the urinary bladder posteriorly suggesting recently passed stone.

Hepatic steatosis.

## 2023-05-27 MED ORDER — IPRATROPIUM BROMIDE 0.03 % NA SOLN
2.0000 | Freq: Two times a day (BID) | NASAL | 0 refills | Status: AC
Start: 2023-05-27 — End: ?

## 2023-05-27 NOTE — Progress Notes (Signed)
E-Visit for Sinus Problems  We are sorry that you are not feeling well.  Here is how we plan to help!  Based on what you have shared with me it looks like you have sinusitis.  Sinusitis is inflammation and infection in the sinus cavities of the head.  Based on your presentation I believe you most likely have Acute Viral Sinusitis.This is an infection most likely caused by a virus. There is not specific treatment for viral sinusitis other than to help you with the symptoms until the infection runs its course.  You may use an oral decongestant such as Mucinex D or if you have glaucoma or high blood pressure use plain Mucinex. Saline nasal spray help and can safely be used as often as needed for congestion, I have prescribed: Ipratropium Bromide nasal spray 0.03% 2 sprays in eah nostril 2-3 times a day. Start this in addition to your Azelastine spray and the Singulair.   Some authorities believe that zinc sprays or the use of Echinacea may shorten the course of your symptoms.  Sinus infections are not as easily transmitted as other respiratory infection, however we still recommend that you avoid close contact with loved ones, especially the very young and elderly.  Remember to wash your hands thoroughly throughout the day as this is the number one way to prevent the spread of infection!  Home Care: Only take medications as instructed by your medical team. Do not take these medications with alcohol. A steam or ultrasonic humidifier can help congestion.  You can place a towel over your head and breathe in the steam from hot water coming from a faucet. Avoid close contacts especially the very young and the elderly. Cover your mouth when you cough or sneeze. Always remember to wash your hands.  Get Help Right Away If: You develop worsening fever or sinus pain. You develop a severe head ache or visual changes. Your symptoms persist after you have completed your treatment plan.  Make sure  you Understand these instructions. Will watch your condition. Will get help right away if you are not doing well or get worse.   Thank you for choosing an e-visit.  Your e-visit answers were reviewed by a board certified advanced clinical practitioner to complete your personal care plan. Depending upon the condition, your plan could have included both over the counter or prescription medications.  Please review your pharmacy choice. Make sure the pharmacy is open so you can pick up prescription now. If there is a problem, you may contact your provider through [employer] and have the prescription routed to another pharmacy.  Your safety is important to [place]. If you have drug allergies check your prescription carefully.   For the next 24 hours you can use MyChart to ask questions about today's visit, request a non-urgent call back, or ask for a work or school excuse. You will get an email in the next two days asking about your experience. I hope that your e-visit has been valuable and will speed your recovery.

## 2023-05-27 NOTE — Progress Notes (Signed)
I have spent 5 minutes in review of e-visit questionnaire, review and updating patient chart, medical decision making and response to patient.   Helayne Metsker Cody Eldra Word, PA-C    

## 2023-05-28 ENCOUNTER — Telehealth: Payer: 59 | Admitting: Physician Assistant

## 2023-05-28 DIAGNOSIS — I10 Essential (primary) hypertension: Secondary | ICD-10-CM

## 2023-05-28 DIAGNOSIS — E7800 Pure hypercholesterolemia, unspecified: Secondary | ICD-10-CM | POA: Diagnosis not present

## 2023-05-28 MED ORDER — ATORVASTATIN CALCIUM 20 MG PO TABS
20.0000 mg | ORAL_TABLET | Freq: Every day | ORAL | 0 refills | Status: AC
Start: 2023-05-28 — End: ?

## 2023-05-28 MED ORDER — AMLODIPINE BESYLATE 10 MG PO TABS
ORAL_TABLET | ORAL | 0 refills | Status: AC
Start: 2023-05-28 — End: ?

## 2023-05-28 MED ORDER — LISINOPRIL 20 MG PO TABS
20.0000 mg | ORAL_TABLET | Freq: Every day | ORAL | 0 refills | Status: AC
Start: 2023-05-28 — End: ?

## 2023-05-28 NOTE — Progress Notes (Signed)
Virtual Visit Consent   Jesse Horton, you are scheduled for a virtual visit with a [facility] provider today. Just as with appointments in the office, your consent must be obtained to participate. Your consent will be active for this visit and any virtual visit you may have with one of our providers in the next 365 days. If you have a MyChart account, a copy of this consent can be sent to you electronically.  As this is a virtual visit, video technology does not allow for your provider to perform a traditional examination. This may limit your provider's ability to fully assess your condition. If your provider identifies any concerns that need to be evaluated in person or the need to arrange testing (such as labs, EKG, etc.), we will make arrangements to do so. Although advances in technology are sophisticated, we cannot ensure that it will always work on either your end or our end. If the connection with a video visit is poor, the visit may have to be switched to a telephone visit. With either a video or telephone visit, we are not always able to ensure that we have a secure connection.  By engaging in this virtual visit, you consent to the provision of healthcare and authorize for your insurance to be billed (if applicable) for the services provided during this visit. Depending on your insurance coverage, you may receive a charge related to this service.  I need to obtain your verbal consent now. Are you willing to proceed with your visit today? Jesse Horton has provided verbal consent on 05/28/2023 for a virtual visit (video or telephone). Jesse Loveless, PA-C  Date: 05/28/2023 11:17 AM  Virtual Visit via Video Note   IMargaretann Horton, connected with  Jesse Horton  ([id number], 12/12/1961) on 05/28/23 at 11:15 AM EDT by a video-enabled telemedicine application and verified that I am speaking with the correct person using two identifiers.  Location: Patient: Virtual Visit  Location Patient: Home Provider: Virtual Visit Location Provider: Home Office   I discussed the limitations of evaluation and management by telemedicine and the availability of in person appointments. The patient expressed understanding and agreed to proceed.    History of Present Illness: Jesse Horton is a 62 y.o. who identifies as a male who was assigned adult at birth, and is being seen today for HTN.  HPI: Hypertension This is a new problem. The current episode started more than 1 year ago. The problem is unchanged. The problem is controlled. Pertinent negatives include no blurred vision, chest pain, headaches, malaise/fatigue, orthopnea, palpitations, peripheral edema or shortness of breath. There are no associated agents to hypertension. Risk factors for coronary artery disease include dyslipidemia, male gender and smoking/tobacco exposure. Past treatments include ACE inhibitors and calcium channel blockers. The current treatment provides moderate improvement. There are no compliance problems.  There is no history of CAD/MI, CVA, heart failure or PVD.      Problems:  Patient Active Problem List   Diagnosis Date Noted   Pruritus 08/24/2022   Moderate persistent asthma without complication 08/24/2022   Other allergic rhinitis 01/29/2018   Erectile dysfunction due to arterial insufficiency 01/09/2015   Elevated blood pressure reading 01/09/2015   HTN (hypertension) 07/29/2012    Allergies: No Known Allergies Medications:  Current Outpatient Medications:    albuterol (VENTOLIN HFA) 108 (90 Base) MCG/ACT inhaler, 2puffs as needed, Disp: , Rfl:    amLODipine (NORVASC) 10 MG tablet, TAKE 1 TABLET(10 MG) BY MOUTH DAILY, Disp: 90  tablet, Rfl: 0   atorvastatin (LIPITOR) 20 MG tablet, Take 1 tablet (20 mg total) by mouth daily. Needs OV for further refills., Disp: 90 tablet, Rfl: 0   azelastine (ASTELIN) 0.1 % nasal spray, SMARTSIG:1-2 Spray(s) Both Nares Twice Daily, Disp: , Rfl:     azelastine (OPTIVAR) 0.05 % ophthalmic solution, Place 1 drop into both eyes 2 (two) times daily., Disp: , Rfl:    Cetirizine HCl (ZYRTEC ALLERGY) 10 MG CAPS, Take 1 capsule (10 mg total) by mouth in the morning and at bedtime. For itching, Disp: 60 capsule, Rfl: 2   DEXILANT 60 MG capsule, Take 1 capsule by mouth daily., Disp: , Rfl:    dorzolamide-timolol (COSOPT) 22.3-6.8 MG/ML ophthalmic solution, dorzolamide hcl/timolol maleate 22.3-6.8 mg/ml soln, Disp: , Rfl:    EPINEPHrine (EPIPEN 2-PAK) 0.3 mg/0.3 mL IJ SOAJ injection, See admin instructions., Disp: , Rfl:    famotidine (PEPCID) 20 MG tablet, Take 20 mg by mouth 2 (two) times daily., Disp: , Rfl:    fluticasone (FLONASE) 50 MCG/ACT nasal spray, 1-2 sprays each nostril, Disp: , Rfl:    ibuprofen (ADVIL) 800 MG tablet, Take 800 mg by mouth every 8 (eight) hours as needed., Disp: , Rfl:    ipratropium (ATROVENT) 0.03 % nasal spray, Place 2 sprays into both nostrils every 12 (twelve) hours., Disp: 30 mL, Rfl: 0   ketorolac (TORADOL) 60 MG/2ML SOLN injection, , Disp: , Rfl:    lisinopril (ZESTRIL) 20 MG tablet, Take 1 tablet (20 mg total) by mouth daily., Disp: 90 tablet, Rfl: 0   montelukast (SINGULAIR) 10 MG tablet, Take 1 tablet (10 mg total) by mouth at bedtime., Disp: 90 tablet, Rfl: 1   omeprazole (PRILOSEC) 20 MG capsule, Take 1 capsule (20 mg total) by mouth daily., Disp: 30 capsule, Rfl: 0   sildenafil (VIAGRA) 50 MG tablet, Take 1 tablet (50 mg total) by mouth as needed for erectile dysfunction. Needs office visit, Disp: 10 tablet, Rfl: 1   SYMBICORT 160-4.5 MCG/ACT inhaler, SMARTSIG:2 Puff(s) By Mouth Twice Daily, Disp: , Rfl:    Vitamin D, Ergocalciferol, (DRISDOL) 1.25 MG (50000 UNIT) CAPS capsule, Take 50,000 Units by mouth once a week., Disp: , Rfl:   Observations/Objective: Patient is well-developed, well-nourished in no acute distress.  Resting comfortably at home.  Head is normocephalic, atraumatic.  No labored breathing.   Speech is clear and coherent with logical content.  Patient is alert and oriented at baseline.    Assessment and Plan: 1. Essential hypertension - amLODipine (NORVASC) 10 MG tablet; TAKE 1 TABLET(10 MG) BY MOUTH DAILY  Dispense: 90 tablet; Refill: 0 - lisinopril (ZESTRIL) 20 MG tablet; Take 1 tablet (20 mg total) by mouth daily.  Dispense: 90 tablet; Refill: 0  2. Hypercholesterolemia - atorvastatin (LIPITOR) 20 MG tablet; Take 1 tablet (20 mg total) by mouth daily. Needs OV for further refills.  Dispense: 90 tablet; Refill: 0  - Medications refilled x 90 days once - Advised he will need to schedule a follow up with his Primary Care office in the next 3 months before refills are due - Will require in person evaluation prior to any future refills from the Virtual Urgent Care department  Follow Up Instructions: I discussed the assessment and treatment plan with the patient. The patient was provided an opportunity to ask questions and all were answered. The patient agreed with the plan and demonstrated an understanding of the instructions.  A copy of instructions were sent to the patient via MyChart unless  otherwise noted below.    The patient was advised to call back or seek an in-person evaluation if the symptoms worsen or if the condition fails to improve as anticipated.  Time:  I spent 8 minutes with the patient via telehealth technology discussing the above problems/concerns.    Jesse Loveless, PA-C

## 2023-05-28 NOTE — Patient Instructions (Signed)
Jesse Horton, thank you for joining Margaretann Loveless, PA-C for today's virtual visit.  While this provider is not your primary care provider (PCP), if your PCP is located in our provider database this encounter information will be shared with them immediately following your visit.   A [facility] MyChart account gives you access to today's visit and all your visits, tests, and labs performed at [facility] " click here if you don't have a [facility] MyChart account or go to [email or web address]  Consent: (Patient) Jesse Horton provided verbal consent for this virtual visit at the beginning of the encounter.  Current Medications:  Current Outpatient Medications:    albuterol (VENTOLIN HFA) 108 (90 Base) MCG/ACT inhaler, 2puffs as needed, Disp: , Rfl:    amLODipine (NORVASC) 10 MG tablet, TAKE 1 TABLET(10 MG) BY MOUTH DAILY, Disp: 90 tablet, Rfl: 0   atorvastatin (LIPITOR) 20 MG tablet, Take 1 tablet (20 mg total) by mouth daily. Needs OV for further refills., Disp: 90 tablet, Rfl: 0   azelastine (ASTELIN) 0.1 % nasal spray, SMARTSIG:1-2 Spray(s) Both Nares Twice Daily, Disp: , Rfl:    azelastine (OPTIVAR) 0.05 % ophthalmic solution, Place 1 drop into both eyes 2 (two) times daily., Disp: , Rfl:    Cetirizine HCl (ZYRTEC ALLERGY) 10 MG CAPS, Take 1 capsule (10 mg total) by mouth in the morning and at bedtime. For itching, Disp: 60 capsule, Rfl: 2   DEXILANT 60 MG capsule, Take 1 capsule by mouth daily., Disp: , Rfl:    dorzolamide-timolol (COSOPT) 22.3-6.8 MG/ML ophthalmic solution, dorzolamide hcl/timolol maleate 22.3-6.8 mg/ml soln, Disp: , Rfl:    EPINEPHrine (EPIPEN 2-PAK) 0.3 mg/0.3 mL IJ SOAJ injection, See admin instructions., Disp: , Rfl:    famotidine (PEPCID) 20 MG tablet, Take 20 mg by mouth 2 (two) times daily., Disp: , Rfl:    fluticasone (FLONASE) 50 MCG/ACT nasal spray, 1-2 sprays each nostril, Disp: , Rfl:    ibuprofen (ADVIL) 800 MG tablet, Take  800 mg by mouth every 8 (eight) hours as needed., Disp: , Rfl:    ipratropium (ATROVENT) 0.03 % nasal spray, Place 2 sprays into both nostrils every 12 (twelve) hours., Disp: 30 mL, Rfl: 0   ketorolac (TORADOL) 60 MG/2ML SOLN injection, , Disp: , Rfl:    lisinopril (ZESTRIL) 20 MG tablet, Take 1 tablet (20 mg total) by mouth daily., Disp: 90 tablet, Rfl: 0   montelukast (SINGULAIR) 10 MG tablet, Take 1 tablet (10 mg total) by mouth at bedtime., Disp: 90 tablet, Rfl: 1   omeprazole (PRILOSEC) 20 MG capsule, Take 1 capsule (20 mg total) by mouth daily., Disp: 30 capsule, Rfl: 0   sildenafil (VIAGRA) 50 MG tablet, Take 1 tablet (50 mg total) by mouth as needed for erectile dysfunction. Needs office visit, Disp: 10 tablet, Rfl: 1   SYMBICORT 160-4.5 MCG/ACT inhaler, SMARTSIG:2 Puff(s) By Mouth Twice Daily, Disp: , Rfl:    Vitamin D, Ergocalciferol, (DRISDOL) 1.25 MG (50000 UNIT) CAPS capsule, Take 50,000 Units by mouth once a week., Disp: , Rfl:    Medications ordered in this encounter:  Meds ordered this encounter  Medications   amLODipine (NORVASC) 10 MG tablet    Sig: TAKE 1 TABLET(10 MG) BY MOUTH DAILY    Dispense:  90 tablet    Refill:  0    Order Specific Question:   Supervising Provider    Answer:   Merrilee Jansky [[id number]]   atorvastatin (LIPITOR) 20 MG tablet    Sig: Take  1 tablet (20 mg total) by mouth daily. Needs OV for further refills.    Dispense:  90 tablet    Refill:  0    Order Specific Question:   Supervising Provider    Answer:   Merrilee Jansky [id number]   lisinopril (ZESTRIL) 20 MG tablet    Sig: Take 1 tablet (20 mg total) by mouth daily.    Dispense:  90 tablet    Refill:  0    Order Specific Question:   Supervising Provider    Answer:   Merrilee Jansky [id number]     *If you need refills on other medications prior to your next appointment, please contact your pharmacy*  Follow-Up: Call back or seek an in-person evaluation if the symptoms worsen or if the  condition fails to improve as anticipated.  [facility] [phone]  Other Instructions DASH Eating Plan DASH stands for Dietary Approaches to Stop Hypertension. The DASH eating plan is a healthy eating plan that has been shown to: Lower high blood pressure (hypertension). Reduce your risk for type 2 diabetes, heart disease, and stroke. Help with weight loss. What are tips for following this plan? Reading food labels Check food labels for the amount of salt (sodium) per serving. Choose foods with less than 5 percent of the Daily Value (DV) of sodium. In general, foods with less than 300 milligrams (mg) of sodium per serving fit into this eating plan. To find whole grains, look for the word "whole" as the first word in the ingredient list. Shopping Buy products labeled as "low-sodium" or "no salt added." Buy fresh foods. Avoid canned foods and pre-made or frozen meals. Cooking Try not to add salt when you cook. Use salt-free seasonings or herbs instead of table salt or sea salt. Check with your health care provider or pharmacist before using salt substitutes. Do not fry foods. Cook foods in healthy ways, such as baking, boiling, grilling, roasting, or broiling. Cook using oils that are good for your heart. These include olive, canola, avocado, soybean, and sunflower oil. Meal planning  Eat a balanced diet. This should include: 4 or more servings of fruits and 4 or more servings of vegetables each day. Try to fill half of your plate with fruits and vegetables. 6-8 servings of whole grains each day. 6 or less servings of lean meat, poultry, or fish each day. 1 oz is 1 serving. A 3 oz (85 g) serving of meat is about the same size as the palm of your hand. One egg is 1 oz (28 g). 2-3 servings of low-fat dairy each day. One serving is 1 cup (237 mL). 1 serving of nuts, seeds, or beans 5 times each week. 2-3 servings of heart-healthy fats. Healthy fats called omega-3 fatty acids  are found in foods such as walnuts, flaxseeds, fortified milks, and eggs. These fats are also found in cold-water fish, such as sardines, salmon, and mackerel. Limit how much you eat of: Canned or prepackaged foods. Food that is high in trans fat, such as fried foods. Food that is high in saturated fat, such as fatty meat. Desserts and other sweets, sugary drinks, and other foods with added sugar. Full-fat dairy products. Do not salt foods before eating. Do not eat more than 4 egg yolks a week. Try to eat at least 2 vegetarian meals a week. Eat more home-cooked food and less restaurant, buffet, and fast food. Lifestyle When eating at a restaurant, ask if your food can  be made with less salt or no salt. If you drink alcohol: Limit how much you have to: 0-1 drink a day if you are male. 0-2 drinks a day if you are male. Know how much alcohol is in your drink. In the [place] one drink is one 12 oz bottle of beer (355 mL), one 5 oz glass of wine (148 mL), or one 1 oz glass of hard liquor (44 mL). General information Avoid eating more than 2,300 mg of salt a day. If you have hypertension, you may need to reduce your sodium intake to 1,500 mg a day. Work with your provider to stay at a healthy body weight or lose weight. Ask what the best weight range is for you. On most days of the week, get at least 30 minutes of exercise that causes your heart to beat faster. This may include walking, swimming, or biking. Work with your provider or dietitian to adjust your eating plan to meet your specific calorie needs. What foods should I eat? Fruits All fresh, dried, or frozen fruit. Canned fruits that are in their natural juice and do not have sugar added to them. Vegetables Fresh or frozen vegetables that are raw, steamed, roasted, or grilled. Low-sodium or reduced-sodium tomato and vegetable juice. Low-sodium or reduced-sodium tomato sauce and tomato paste. Low-sodium or reduced-sodium canned  vegetables. Grains Whole-grain or whole-wheat bread. Whole-grain or whole-wheat pasta. Brown rice. Orpah Cobb. Bulgur. Whole-grain and low-sodium cereals. Pita bread. Low-fat, low-sodium crackers. Whole-wheat flour tortillas. Meats and other proteins Skinless chicken or [place]. Ground chicken or [place]. Pork with fat trimmed off. Fish and seafood. Egg whites. Dried beans, peas, or lentils. Unsalted nuts, nut butters, and seeds. Unsalted canned beans. Lean cuts of beef with fat trimmed off. Low-sodium, lean precooked or cured meat, such as sausages or meat loaves. Dairy Low-fat (1%) or fat-free (skim) milk. Reduced-fat, low-fat, or fat-free cheeses. Nonfat, low-sodium ricotta or cottage cheese. Low-fat or nonfat yogurt. Low-fat, low-sodium cheese. Fats and oils Soft margarine without trans fats. Vegetable oil. Reduced-fat, low-fat, or light mayonnaise and salad dressings (reduced-sodium). Canola, safflower, olive, avocado, soybean, and sunflower oils. Avocado. Seasonings and condiments Herbs. Spices. Seasoning mixes without salt. Other foods Unsalted popcorn and pretzels. Fat-free sweets. The items listed above may not be all the foods and drinks you can have. Talk to a dietitian to learn more. What foods should I avoid? Fruits Canned fruit in a light or heavy syrup. Fried fruit. Fruit in cream or butter sauce. Vegetables Creamed or fried vegetables. Vegetables in a cheese sauce. Regular canned vegetables that are not marked as low-sodium or reduced-sodium. Regular canned tomato sauce and paste that are not marked as low-sodium or reduced-sodium. Regular tomato and vegetable juices that are not marked as low-sodium or reduced-sodium. Rosita Fire. Olives. Grains Baked goods made with fat, such as croissants, muffins, or some breads. Dry pasta or rice meal packs. Meats and other proteins Fatty cuts of meat. Ribs. Fried meat. Tomasa Blase. Bologna, salami, and other precooked or cured meats, such as  sausages or meat loaves, that are not lean and low in sodium. Fat from the back of a pig (fatback). Bratwurst. Salted nuts and seeds. Canned beans with added salt. Canned or smoked fish. Whole eggs or egg yolks. Chicken or [place] with skin. Dairy Whole or 2% milk, cream, and half-and-half. Whole or full-fat cream cheese. Whole-fat or sweetened yogurt. Full-fat cheese. Nondairy creamers. Whipped toppings. Processed cheese and cheese spreads. Fats and oils Butter. Stick margarine. Lard. Shortening. Ghee.  Bacon fat. Tropical oils, such as coconut, palm kernel, or palm oil. Seasonings and condiments Onion salt, garlic salt, seasoned salt, table salt, and sea salt. Worcestershire sauce. Tartar sauce. Barbecue sauce. Teriyaki sauce. Soy sauce, including reduced-sodium soy sauce. Steak sauce. Canned and packaged gravies. Fish sauce. Oyster sauce. Cocktail sauce. Store-bought horseradish. Ketchup. Mustard. Meat flavorings and tenderizers. Bouillon cubes. Hot sauces. Pre-made or packaged marinades. Pre-made or packaged taco seasonings. Relishes. Regular salad dressings. Other foods Salted popcorn and pretzels. The items listed above may not be all the foods and drinks you should avoid. Talk to a dietitian to learn more. Where to find more information National Heart, Lung, and Blood Institute (NHLBI): BuffaloDryCleaner.gl American Heart Association (AHA): heart.org Academy of Nutrition and Dietetics: eatright.org National Kidney Foundation (NKF): kidney.org This information is not intended to replace advice given to you by your health care provider. Make sure you discuss any questions you have with your health care provider. Document Revised: 12/17/2022 Document Reviewed: 12/17/2022 Elsevier Patient Education  2024 Elsevier Inc.    If you have been instructed to have an in-person evaluation today at a local Urgent Care facility, please use the link below. It will take you to a list of all of our available [facility], including address, phone number and hours of operation. Please do not delay care.  [facility]  If you or a family member do not have a primary care provider, use the link below to schedule a visit and establish care. When you choose a [facility] primary care physician or advanced practice provider, you gain a long-term partner in health. Find a Primary Care Provider  Learn more about [facility]'s in-office and virtual care options: [facility] - Get Care Now

## 2023-06-11 ENCOUNTER — Telehealth: Payer: 59 | Admitting: Family Medicine

## 2023-06-11 NOTE — Progress Notes (Signed)
The patient no-showed for appointment despite this provider sending direct link, reaching out via phone with no response and waiting for at least 10 minutes from appointment time for patient to join. They will be marked as a NS for this appointment/time.   Bence Trapp M Ayush Boulet, NP    

## 2023-07-02 ENCOUNTER — Other Ambulatory Visit: Payer: Self-pay | Admitting: Internal Medicine

## 2023-07-03 LAB — COMPLETE METABOLIC PANEL WITH GFR
AG Ratio: 1.4 (calc) (ref 1.0–2.5)
ALT: 20 U/L (ref 9–46)
AST: 19 U/L (ref 10–35)
Albumin: 4.2 g/dL (ref 3.6–5.1)
Alkaline phosphatase (APISO): 75 U/L (ref 35–144)
BUN: 11 mg/dL (ref 7–25)
CO2: 20 mmol/L (ref 20–32)
Calcium: 9.1 mg/dL (ref 8.6–10.3)
Chloride: 105 mmol/L (ref 98–110)
Creat: 1.05 mg/dL (ref 0.70–1.35)
Globulin: 3.1 g/dL (calc) (ref 1.9–3.7)
Glucose, Bld: 139 mg/dL — ABNORMAL HIGH (ref 65–99)
Potassium: 3.4 mmol/L — ABNORMAL LOW (ref 3.5–5.3)
Sodium: 136 mmol/L (ref 135–146)
Total Bilirubin: 0.4 mg/dL (ref 0.2–1.2)
Total Protein: 7.3 g/dL (ref 6.1–8.1)
eGFR: 81 mL/min/{1.73_m2} (ref >=60)

## 2023-07-03 LAB — CBC
HCT: 46.3 % (ref 38.5–50.0)
Hemoglobin: 15.6 g/dL (ref 13.2–17.1)
MCH: 29.5 pg (ref 27.0–33.0)
MCHC: 33.7 g/dL (ref 32.0–36.0)
MCV: 87.7 fL (ref 80.0–100.0)
MPV: 8.9 fL (ref 7.5–12.5)
Platelets: 225 10*3/uL (ref 140–400)
RBC: 5.28 10*6/uL (ref 4.20–5.80)
RDW: 13.5 % (ref 11.0–15.0)
WBC: 3.8 10*3/uL (ref 3.8–10.8)

## 2023-07-03 LAB — VITAMIN D 25 HYDROXY (VIT D DEFICIENCY, FRACTURES): Vit D, 25-Hydroxy: 49 ng/mL (ref 30–100)

## 2023-07-03 LAB — LIPID PANEL
Cholesterol: 134 mg/dL (ref <200)
HDL: 34 mg/dL — ABNORMAL LOW (ref >=40)
LDL Cholesterol (Calc): 77 mg/dL (calc)
Non-HDL Cholesterol (Calc): 100 mg/dL (calc) (ref <130)
Total CHOL/HDL Ratio: 3.9 (calc) (ref <5.0)
Triglycerides: 130 mg/dL (ref <150)

## 2023-07-03 LAB — PSA: PSA: 2.8 ng/mL (ref <=4.00)

## 2023-07-03 LAB — TSH: TSH: 0.73 mIU/L (ref 0.40–4.50)

## 2023-10-15 ENCOUNTER — Other Ambulatory Visit: Payer: Self-pay | Admitting: Physician Assistant

## 2023-10-15 ENCOUNTER — Other Ambulatory Visit: Payer: Self-pay | Admitting: Emergency Medicine

## 2023-10-15 ENCOUNTER — Telehealth: Payer: 59

## 2023-10-15 ENCOUNTER — Telehealth: Payer: 59 | Admitting: Family Medicine

## 2023-10-15 DIAGNOSIS — M5442 Lumbago with sciatica, left side: Secondary | ICD-10-CM | POA: Diagnosis not present

## 2023-10-15 DIAGNOSIS — E7800 Pure hypercholesterolemia, unspecified: Secondary | ICD-10-CM

## 2023-10-15 MED ORDER — CYCLOBENZAPRINE HCL 10 MG PO TABS
5.0000 mg | ORAL_TABLET | Freq: Three times a day (TID) | ORAL | 0 refills | Status: AC | PRN
Start: 2023-10-15 — End: ?

## 2023-10-15 MED ORDER — NAPROXEN 500 MG PO TABS
500.0000 mg | ORAL_TABLET | Freq: Two times a day (BID) | ORAL | 0 refills | Status: AC
Start: 2023-10-15 — End: ?

## 2023-10-15 NOTE — Progress Notes (Signed)
